# Patient Record
Sex: Female | Born: 1980 | Race: Black or African American | Hispanic: No | Marital: Married | Smoking: Never smoker
Health system: Southern US, Community
[De-identification: ages and names within clinical notes are randomized; demographics above are authoritative.]

## PROBLEM LIST (undated history)

## (undated) DIAGNOSIS — D649 Anemia, unspecified: Secondary | ICD-10-CM

## (undated) DIAGNOSIS — E669 Obesity, unspecified: Secondary | ICD-10-CM

## (undated) DIAGNOSIS — Z975 Presence of (intrauterine) contraceptive device: Secondary | ICD-10-CM

## (undated) DIAGNOSIS — N946 Dysmenorrhea, unspecified: Secondary | ICD-10-CM

## (undated) HISTORY — DX: Presence of (intrauterine) contraceptive device: Z97.5

## (undated) HISTORY — DX: Anemia, unspecified: D64.9

## (undated) HISTORY — DX: Dysmenorrhea, unspecified: N94.6

## (undated) HISTORY — DX: Obesity, unspecified: E66.9

---
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
## 2011-01-30 HISTORY — PX: DILATION AND CURETTAGE OF UTERUS: SHX78

## 2012-01-30 DIAGNOSIS — Z975 Presence of (intrauterine) contraceptive device: Secondary | ICD-10-CM

## 2012-01-30 HISTORY — DX: Presence of (intrauterine) contraceptive device: Z97.5

## 2012-01-30 HISTORY — PX: INSERTION OF CONTRACEPTIVE CAPSULE: SHX680

## 2015-07-25 ENCOUNTER — Encounter: Payer: Self-pay | Admitting: Medical

## 2015-08-15 ENCOUNTER — Ambulatory Visit (INDEPENDENT_AMBULATORY_CARE_PROVIDER_SITE_OTHER): Payer: PRIVATE HEALTH INSURANCE | Admitting: Medical

## 2015-08-15 ENCOUNTER — Encounter: Payer: Self-pay | Admitting: Medical

## 2015-08-15 VITALS — BP 126/74 | HR 92 | Ht 62.75 in | Wt 312.0 lb

## 2015-08-15 DIAGNOSIS — Z139 Encounter for screening, unspecified: Secondary | ICD-10-CM | POA: Diagnosis not present

## 2015-08-15 DIAGNOSIS — Z0000 Encounter for general adult medical examination without abnormal findings: Secondary | ICD-10-CM

## 2015-08-15 DIAGNOSIS — N946 Dysmenorrhea, unspecified: Secondary | ICD-10-CM | POA: Diagnosis not present

## 2015-08-15 DIAGNOSIS — D509 Iron deficiency anemia, unspecified: Secondary | ICD-10-CM | POA: Diagnosis not present

## 2015-08-15 DIAGNOSIS — E669 Obesity, unspecified: Secondary | ICD-10-CM | POA: Diagnosis not present

## 2015-08-15 DIAGNOSIS — N898 Other specified noninflammatory disorders of vagina: Secondary | ICD-10-CM

## 2015-08-15 DIAGNOSIS — Z975 Presence of (intrauterine) contraceptive device: Secondary | ICD-10-CM | POA: Diagnosis not present

## 2015-08-15 DIAGNOSIS — Z8349 Family history of other endocrine, nutritional and metabolic diseases: Secondary | ICD-10-CM

## 2015-08-15 DIAGNOSIS — R350 Frequency of micturition: Secondary | ICD-10-CM

## 2015-08-15 LAB — POCT WET PREP (WET MOUNT)

## 2015-08-15 LAB — POCT URINALYSIS DIPSTICK
Bilirubin, UA: NEGATIVE
Glucose, UA: NEGATIVE
Ketones, UA: NEGATIVE
LEUKOCYTES UA: NEGATIVE
NITRITE UA: NEGATIVE
PROTEIN UA: NEGATIVE
SPEC GRAV UA: 1.02
UROBILINOGEN UA: 0.2
pH, UA: 7

## 2015-08-15 NOTE — Progress Notes (Signed)
Subjective:   HPI  Tamara Rowland is a 35 y.o. female who presents for a complete physical.  New patient today.   Daughter of my patient Tamara Rowland.     2015 - last tetanus booster.  Gets flu shot yearly.  Works at [employer] in environmental services.  Has heavy periods, dysmenorrhea, irregular periods despite being on Nexplanon.   During periods, has to sometimes wear 2 sanitary napkins, 2 spoonful size of clots.   Uterine polyps.   Has hx/o 1 pregnancy, 1 live birth, last pap 2 years ago.  Currently on phentermine.  Has been seeing bariatric clinic. Did have consult for weight loss surgery, but has been able to see significant improvement with lifestyle changes too.  Wants me to take over the prescription for phentermine for ongoing use.  Reviewed their medical, surgical, family, social, medication, and allergy history and updated chart as appropriate.  Past Medical History  Diagnosis Date  . Anemia 2003    ongoing, has had heavy periods, iron deficiency  . Obesity   . Dysmenorrhea   . Nexplanon in place 01/2012    Past Surgical History  Procedure Laterality Date  . Dilation and curettage of uterus  2013  . Insertion of contraceptive capsule  01/2012    nexplanon    Social History   Social History  . Marital Status: Single    Spouse Name: N/A  . Number of Children: N/A  . Years of Education: N/A   Occupational History  . Not on file.   Social History Main Topics  . Smoking status: Never Smoker   . Smokeless tobacco: Not on file  . Alcohol Use: No  . Drug Use: No  . Sexual Activity: Not on file   Other Topics Concern  . Not on file   Social History Narrative   Works in Public affairs consultant at [employer], lives in [place], works in [place].   Has one child.  Exercise - sometimes goes to the gym, goes to [employer].  No significant other.  Divorced.   07/2015.    Family History  Problem Relation Age of Onset  . Hypertension Mother    . Obesity Mother   . Diabetes Maternal Aunt   . Cancer Maternal Grandmother     stomach  . Diabetes Maternal Grandmother   . Stroke Neg Hx   . Heart disease Neg Hx   . Cancer Other     stomach, cervical, bone, prostate, distant relatives  . Diabetes Maternal Aunt      Current outpatient prescriptions:  .  phentermine 37.5 MG capsule, Take 37.5 mg by mouth every morning., Disp: , Rfl:   No Known Allergies   Review of Systems Constitutional: -fever, -chills, -sweats, -unexpected weight change, -decreased appetite, -fatigue Allergy: -sneezing, -itching, -congestion Dermatology: -changing moles, --rash, -lumps ENT: -runny nose, -ear pain, -sore throat, -hoarseness, -sinus pain, -teeth pain, - ringing in ears, -hearing loss, -nosebleeds Cardiology: -chest pain, -palpitations, -swelling, -difficulty breathing when lying flat, -waking up short of breath Respiratory: -cough, -shortness of breath, -difficulty breathing with exercise or exertion, -wheezing, -coughing up blood Gastroenterology: -abdominal pain, -nausea, -vomiting, -diarrhea, -constipation, -blood in stool, -changes in bowel movement, -difficulty swallowing or eating Hematology: -bleeding, -bruising  Musculoskeletal: -joint aches, -muscle aches, -joint swelling, -back pain, -neck pain, -cramping, -changes in gait Ophthalmology: denies vision changes, eye redness, itching, discharge Urology: -burning with urination, -difficulty urinating, -blood in urine, +urinary frequency, -urgency, -incontinence Neurology: -headache, -weakness, -tingling, -numbness, -memory loss, -falls, -  dizziness Psychology: -depressed mood, -agitation, -sleep problems     Objective:   Physical Exam  BP 126/74 mmHg  Pulse 92  Ht 5' 2.75" (1.594 m)  Wt 312 lb (141.522 kg)  BMI 55.70 kg/m2  General appearance: alert, no distress, WD/WN, obese AA female Skin: tattoos bilat upper lateral arms, wine the pooh tattoo right lateral upper arm, scattered  macules, no worrisome lesions HEENT: normocephalic, conjunctiva/corneas normal, sclerae anicteric, PERRLA, EOMi, nares patent, no discharge or erythema, pharynx normal Oral cavity: MMM, tongue normal, teeth in good repair Neck: supple, no lymphadenopathy, no thyromegaly, no masses, normal ROM, no bruits Chest: non tender, normal shape and expansion Heart: RRR, normal S1, S2, no murmurs Lungs: CTA bilaterally, no wheezes, rhonchi, or rales Abdomen: +bs, soft, non tender, non distended, no masses, no hepatomegaly, no splenomegaly, no bruits Back: non tender, normal ROM, no scoliosis Musculoskeletal: upper extremities non tender, no obvious deformity, normal ROM throughout, lower extremities non tender, no obvious deformity, normal ROM throughout Extremities: no edema, no cyanosis, no clubbing Pulses: 2+ symmetric, upper and lower extremities, normal cap refill Neurological: alert, oriented x 3, CN2-12 intact, strength normal upper extremities and lower extremities, sensation normal throughout, DTRs 2+ throughout, no cerebellar signs, gait normal Psychiatric: normal affect, behavior normal, pleasant  Breast/gyn/rectal - deferred to gyn    Assessment and Plan :    Encounter Diagnoses  Name Primary?  . Routine general medical examination at a health care facility Yes  . Obesity   . Nexplanon in place   . Anemia, iron deficiency   . Dysmenorrhea   . Screening for condition   . Urinary frequency   . Vaginal discharge   . Family history of thyroid disease    Physical exam - discussed healthy lifestyle, diet, exercise, preventative care, vaccinations, and addressed their concerns.  Handout given. See your eye doctor yearly for routine vision care. See your dentist yearly for routine dental care including hygiene visits twice yearly. Refer to gynecology, needs pap smear update  Obesity- c/t efforts with diet, exercise.  discussed her current use of phentermine.  Consider other safer  ongoing alternatives, still keep weight loss surgery on the table as an option  Refer to gynecology regarding dysmenorrhea, Nexplanon due to come out 01/2016  Vaginal discharge, urinary frequency - urine culture sent.   Self wet prep unremarkable  Follow-up pending labs

## 2015-08-16 ENCOUNTER — Other Ambulatory Visit: Payer: Self-pay | Admitting: Medical

## 2015-08-16 LAB — COMPREHENSIVE METABOLIC PANEL
ALBUMIN: 3.9 g/dL (ref 3.6–5.1)
ALK PHOS: 63 U/L (ref 33–115)
ALT: 10 U/L (ref 6–29)
AST: 8 U/L — AB (ref 10–30)
BILIRUBIN TOTAL: 0.4 mg/dL (ref 0.2–1.2)
BUN: 11 mg/dL (ref 7–25)
CALCIUM: 8.8 mg/dL (ref 8.6–10.2)
CO2: 27 mmol/L (ref 20–31)
CREATININE: 0.69 mg/dL (ref 0.50–1.10)
Chloride: 105 mmol/L (ref 98–110)
Glucose, Bld: 68 mg/dL (ref 65–99)
Potassium: 3.6 mmol/L (ref 3.5–5.3)
SODIUM: 142 mmol/L (ref 135–146)
TOTAL PROTEIN: 6.9 g/dL (ref 6.1–8.1)

## 2015-08-16 LAB — HEPATITIS B SURFACE ANTIBODY, QUANTITATIVE: Hepatitis B-Post: 53 m[IU]/mL

## 2015-08-16 LAB — CBC
HEMATOCRIT: 36.8 % (ref 35.0–45.0)
HEMOGLOBIN: 11 g/dL — AB (ref 11.7–15.5)
MCH: 22.4 pg — AB (ref 27.0–33.0)
MCHC: 29.9 g/dL — AB (ref 32.0–36.0)
MCV: 74.8 fL — AB (ref 80.0–100.0)
MPV: 8.7 fL (ref 7.5–12.5)
Platelets: 309 10*3/uL (ref 140–400)
RBC: 4.92 MIL/uL (ref 3.80–5.10)
RDW: 16.7 % — ABNORMAL HIGH (ref 11.0–15.0)
WBC: 10 10*3/uL (ref 4.0–10.5)

## 2015-08-16 LAB — TSH: TSH: 3.15 m[IU]/L

## 2015-08-16 LAB — HEMOGLOBIN A1C
HEMOGLOBIN A1C: 6 % — AB (ref <5.7)
MEAN PLASMA GLUCOSE: 126 mg/dL

## 2015-08-16 LAB — LIPID PANEL
CHOLESTEROL: 129 mg/dL (ref 125–200)
HDL: 45 mg/dL — AB (ref >=46)
LDL Cholesterol: 71 mg/dL (ref <130)
TRIGLYCERIDES: 64 mg/dL (ref <150)
Total CHOL/HDL Ratio: 2.9 Ratio (ref <=5.0)
VLDL: 13 mg/dL (ref <30)

## 2015-08-16 LAB — T4, FREE: Free T4: 1 ng/dL (ref 0.8–1.8)

## 2015-08-16 LAB — URINE CULTURE: ORGANISM ID, BACTERIA: NO GROWTH

## 2015-08-16 MED ORDER — PHENTERMINE-TOPIRAMATE ER 3.75-23 MG PO CP24: 1.0000 | ORAL_CAPSULE | ORAL | Status: DC

## 2015-08-16 MED ORDER — PHENTERMINE-TOPIRAMATE ER 7.5-46 MG PO CP24: 1.0000 | ORAL_CAPSULE | ORAL | Status: DC

## 2015-08-16 NOTE — Addendum Note (Signed)
Addended by: Kieth BrightlyLAWSON, Ludene Stokke M on: 08/16/2015 11:35 AM   Modules accepted: Kipp BroodSmartSet

## 2015-08-16 NOTE — Addendum Note (Signed)
Addended by: Kieth BrightlyLAWSON, Heidemarie Goodnow M on: 08/16/2015 10:14 AM   Modules accepted: Orders, SmartSet

## 2015-09-09 ENCOUNTER — Ambulatory Visit: Payer: PRIVATE HEALTH INSURANCE | Admitting: Obstetrics and Gynecology

## 2015-09-13 ENCOUNTER — Telehealth: Payer: Self-pay | Admitting: Medical

## 2015-09-13 NOTE — Telephone Encounter (Signed)
Pt said that she forgot about her appt, and has been referred to another office. Said she will not no show again

## 2015-09-13 NOTE — Telephone Encounter (Signed)
Please call patient and inquire.  I received notification from Gastroenterology that she either no showed or wasn't able to be contacted for their appointment in which we referred them.   Please make sure they are in agreement for the referral, and make sure they are aware that when we refer to a specialist, no showing or not being able to be reached can result in the specialist not taking them as a patient, make cause them to incur no show fees, and is just not appropriate in general to us or them.  Please either have them call back to set up the appointment or document their reply.

## 2015-09-16 ENCOUNTER — Other Ambulatory Visit: Payer: Self-pay | Admitting: Medical

## 2015-09-16 ENCOUNTER — Telehealth: Payer: Self-pay

## 2015-09-16 MED ORDER — PHENTERMINE HCL 37.5 MG PO CAPS: 37.5000 mg | ORAL_CAPSULE | ORAL | 0 refills | Status: DC

## 2015-09-16 NOTE — Telephone Encounter (Signed)
Call out 36mo of plain phentermine then recheck to discuss. I can't keep her on generic phentermine as its not safe to use ongoing, its only meant to be used short term a few times per year.

## 2015-09-16 NOTE — Telephone Encounter (Signed)
Pt is aware and phoned in

## 2015-09-16 NOTE — Telephone Encounter (Signed)
Pt left me a voicemail regarding her Qsymia stating she doesn't think her body is adjusting to it well. When I called her back she said that she is more hungry and eating way more. Has gained 3 pounds already. Said that she is more exhausted now then being on phentermine. York SpanielSaid it makes her chest feel heavy as well. Wants her phentermine.

## 2015-10-10 ENCOUNTER — Ambulatory Visit: Payer: PRIVATE HEALTH INSURANCE | Admitting: Gynecology

## 2015-10-10 DIAGNOSIS — Z0289 Encounter for other administrative examinations: Secondary | ICD-10-CM

## 2015-10-13 ENCOUNTER — Telehealth: Payer: Self-pay | Admitting: Medical

## 2015-10-13 NOTE — Telephone Encounter (Signed)
Pt called and states that she would like a rx for iron states that it is a red pill that starts with a F, states you and her discussed this at her last visit, and that she has low Iron pt uses [facility] [place] - [address] and pt can be reached at [phone]

## 2015-10-14 ENCOUNTER — Other Ambulatory Visit: Payer: Self-pay | Admitting: Medical

## 2015-10-14 MED ORDER — FERROUS GLUCONATE 324 (38 FE) MG PO TABS: 324.0000 mg | ORAL_TABLET | Freq: Every day | ORAL | 2 refills | Status: DC

## 2015-10-14 NOTE — Telephone Encounter (Signed)
Called and informed pt that RX was sent in, she also states that she has not started the weight loss medicine states she was waiting until she stopped her menstrual cycle, states she was going to start he weight loss medicine on Monday and would call and make a FU appt after she start the medicine.

## 2015-10-14 NOTE — Telephone Encounter (Signed)
Med sent.   If she started the weigh toss medication from last visit, she is due f/u at this time.

## 2016-04-10 ENCOUNTER — Encounter: Payer: Self-pay | Admitting: Podiatry

## 2016-06-13 ENCOUNTER — Encounter: Payer: Self-pay | Admitting: Gynecology

## 2016-06-21 ENCOUNTER — Encounter (HOSPITAL_COMMUNITY): Payer: Self-pay | Admitting: Emergency Medicine

## 2016-06-21 ENCOUNTER — Emergency Department (HOSPITAL_COMMUNITY)
Admission: EM | Admit: 2016-06-21 | Discharge: 2016-06-21 | Disposition: A | Discharge status: Home / Self Care | Payer: Medicaid Other | Attending: Emergency Medicine | Admitting: Emergency Medicine

## 2016-06-21 ENCOUNTER — Emergency Department (HOSPITAL_COMMUNITY): Payer: Medicaid Other

## 2016-06-21 DIAGNOSIS — Z79899 Other long term (current) drug therapy: Secondary | ICD-10-CM | POA: Insufficient documentation

## 2016-06-21 DIAGNOSIS — M722 Plantar fascial fibromatosis: Secondary | ICD-10-CM | POA: Diagnosis not present

## 2016-06-21 DIAGNOSIS — M79671 Pain in right foot: Secondary | ICD-10-CM | POA: Diagnosis present

## 2016-06-21 LAB — POCT PREGNANCY, URINE: PREG TEST UR: NEGATIVE

## 2016-06-21 MED ORDER — DICLOFENAC SODIUM 75 MG PO TBEC: 75.0000 mg | DELAYED_RELEASE_TABLET | Freq: Two times a day (BID) | ORAL | 0 refills | Status: DC

## 2016-06-21 NOTE — ED Triage Notes (Signed)
Pt c/o sudden R foot / heel pain this morning when getting out of bed. Pt states difficulty walking, pain when walking that radiates up leg. Pt denies injury.

## 2016-06-21 NOTE — ED Provider Notes (Signed)
[facility]EM[facility]ENCY [facility] Provider Note   CSN: [id number] Arrival date & time: 06/21/16  4098  By signing my name below, I, Linna Darner, attest that this documentation has been prepared under the direction and in the presence of Langston Masker, [place]. Electronically Signed: Linna Darner, Scribe. 06/21/2016. 10:43 AM.  History   Chief Complaint Chief Complaint  Patient presents with  . Foot Pain   The history is provided by the patient. No language interpreter was used.    HPI Comments: Tamara Rowland is a 36 y.o. female who presents to the Emergency Department complaining of constant right heel pain beginning this morning upon waking. She states her pain is worse with weight bearing and ambulating. She has no h/o the same. No medications or treatments tried PTA. Patient is currently unemployed but previously held a job for which she was on her feet often. She denies numbness/tingling, focal weakness, or any other associated symptoms.  Past Medical History:  Diagnosis Date  . Anemia 2003   ongoing, has had heavy periods, iron deficiency  . Dysmenorrhea   . Nexplanon in place 01/2012  . Obesity     Patient Active Problem List   Diagnosis Date Noted  . Dysmenorrhea 08/15/2015  . Anemia, iron deficiency 08/15/2015  . Nexplanon in place 08/15/2015  . Obesity 08/15/2015  . Routine general medical examination at a health care facility 08/15/2015  . Screening for condition 08/15/2015  . Urinary frequency 08/15/2015  . Family history of thyroid disease 08/15/2015  . Vaginal discharge 08/15/2015    Past Surgical History:  Procedure Laterality Date  . DILATION AND CURETTAGE OF UTERUS  2013  . INSERTION OF CONTRACEPTIVE CAPSULE  01/2012   nexplanon    OB History    No data available       Home Medications    Prior to Admission medications   Medication Sig Start Date End Date Taking? Authorizing Provider  ferrous gluconate (FERGON) 324 MG tablet Take 1 tablet (324 mg total) by  mouth daily with breakfast. 10/14/15   Tysinger, [name], PA-C  phentermine 37.5 MG capsule Take 1 capsule (37.5 mg total) by mouth every morning. 09/16/15   Tysinger, [name], PA-C  Phentermine-Topiramate (QSYMIA) 3.75-23 MG CP24 Take 1 capsule by mouth every morning. 08/16/15   Tysinger, [name], PA-C  Phentermine-Topiramate (QSYMIA) 7.5-46 MG CP24 Take 1 capsule by mouth every morning. 08/16/15   Tysinger, [name], PA-C    Family History Family History  Problem Relation Age of Onset  . Hypertension Mother   . Obesity Mother   . Diabetes Maternal Aunt   . Cancer Maternal Grandmother        stomach  . Diabetes Maternal Grandmother   . Cancer Other        stomach, cervical, bone, prostate, distant relatives  . Diabetes Maternal Aunt   . Stroke Neg Hx   . Heart disease Neg Hx     Social History Social History  Substance Use Topics  . Smoking status: Never Smoker  . Smokeless tobacco: Never Used  . Alcohol use No     Allergies   Patient has no known allergies.   Review of Systems Review of Systems  Musculoskeletal: Positive for arthralgias.  Neurological: Negative for weakness and numbness.  All other systems reviewed and are negative.  Physical Exam Updated Vital Signs BP (!) 145/82   Pulse 92   Temp 98.4 F (36.9 C) (Oral)   Resp 18   LMP 06/12/2016 (Exact Date)  SpO2 99%   Physical Exam  Constitutional: She is oriented to person, place, and time. She appears well-developed and well-nourished. No distress.  HENT:  Head: Normocephalic and atraumatic.  Eyes: Conjunctivae and EOM are normal.  Neck: Neck supple. No tracheal deviation present.  Cardiovascular: Normal rate.   Pulmonary/Chest: Effort normal. No respiratory distress.  Musculoskeletal: She exhibits tenderness.  Tender right heel and pain with ROM.  Neurological: She is alert and oriented to person, place, and time.  Skin: Skin is warm and dry.  Psychiatric: She has a normal mood and affect. Her  behavior is normal.  Nursing note and vitals reviewed.  ED Treatments / Results  Labs (all labs ordered are listed, but only abnormal results are displayed) Labs Reviewed  POC URINE PREG, ED    EKG  EKG Interpretation None       Radiology Dg Foot Complete Right  Result Date: 06/21/2016 CLINICAL DATA:  Right foot and heel pain. EXAM: RIGHT FOOT COMPLETE - 3+ VIEW COMPARISON:  No prior. FINDINGS: No acute bony or joint abnormality identified. No evidence of fracture or dislocation. Soft tissues are unremarkable. IMPRESSION: No acute abnormality. Electronically Signed   By: Maisie Fushomas  Register   On: 06/21/2016 10:30    Procedures Procedures (including critical care time)  DIAGNOSTIC STUDIES: Oxygen Saturation is 99% on RA, normal by my interpretation.    COORDINATION OF CARE: 10:39 AM Discussed treatment plan with pt at bedside and pt agreed to plan.  Medications Ordered in ED Medications - No data to display   Initial Impression / Assessment and Plan / ED Course  I have reviewed the triage vital signs and the nursing notes.  Pertinent labs & imaging results that were available during my care of the patient were reviewed by me and considered in my medical decision making (see chart for details).  Patient's x-ray shows a calcaneal spur consistent with chronic plantar fasciitis.   Final Clinical Impressions(s) / ED Diagnoses   Final diagnoses:  Plantar fasciitis of right foot    New Prescriptions Discharge Medication List as of 06/21/2016 10:44 AM    START taking these medications   Details  diclofenac (VOLTAREN) 75 MG EC tablet Take 1 tablet (75 mg total) by mouth 2 (two) times daily., Starting Thu 06/21/2016, Print       I personally performed the services in this documentation, which was scribed in my presence.  The recorded information has been reviewed and considered.   Barnet PallKaren SofiaPAC. An After Visit Summary was printed and given to the patient.   Elson AreasSofia, Leslie  K, PA-C 06/21/16 1431    Pricilla LovelessGoldston, Scott, MD 06/22/16 [phone]

## 2016-06-22 NOTE — Progress Notes (Signed)
DOS 04/10/2016 [place] Bunionectomy (cutting and moving bone) with pin fixation(right)foot

## 2016-07-02 ENCOUNTER — Ambulatory Visit: Payer: Medicaid Other | Admitting: Podiatry

## 2016-07-04 ENCOUNTER — Ambulatory Visit: Payer: Medicaid Other | Admitting: Podiatry

## 2016-07-23 ENCOUNTER — Ambulatory Visit: Payer: Medicaid Other | Admitting: Podiatry

## 2016-12-08 ENCOUNTER — Encounter (HOSPITAL_COMMUNITY): Payer: Self-pay | Admitting: Emergency Medicine

## 2016-12-08 ENCOUNTER — Ambulatory Visit (HOSPITAL_COMMUNITY)
Admission: EM | Admit: 2016-12-08 | Discharge: 2016-12-08 | Disposition: A | Discharge status: Home / Self Care | Payer: Medicaid Other | Attending: Radiology | Admitting: Radiology

## 2016-12-08 ENCOUNTER — Other Ambulatory Visit: Payer: Self-pay

## 2016-12-08 DIAGNOSIS — R059 Cough, unspecified: Secondary | ICD-10-CM

## 2016-12-08 DIAGNOSIS — R05 Cough: Secondary | ICD-10-CM | POA: Diagnosis not present

## 2016-12-08 MED ORDER — PREDNISONE 20 MG PO TABS: 40.0000 mg | ORAL_TABLET | Freq: Every day | ORAL | 0 refills | Status: AC

## 2016-12-08 MED ORDER — BENZONATATE 100 MG PO CAPS: 100.0000 mg | ORAL_CAPSULE | Freq: Three times a day (TID) | ORAL | 0 refills | Status: DC

## 2016-12-08 NOTE — Discharge Instructions (Signed)
Continue to push fluid.

## 2016-12-08 NOTE — ED Provider Notes (Signed)
[facility]    CSN: [id number] Arrival date & time: 12/08/16  1934     History   Chief Complaint Chief Complaint  Patient presents with  . Cough    HPI Tamara Rowland is a 36 y.o. female.   36 y.o. female presents with residual cough, shob  X 1 week. Patient states that she had cold that self resolved but endorses a persistent cough. Condition is acute in nature. Condition is made better by nothing. Condition is made worse by nothing. Patient denies any treatment prior to there arrival at this facility.        Past Medical History:  Diagnosis Date  . Anemia 2003   ongoing, has had heavy periods, iron deficiency  . Dysmenorrhea   . Nexplanon in place 01/2012  . Obesity     Patient Active Problem List   Diagnosis Date Noted  . Dysmenorrhea 08/15/2015  . Anemia, iron deficiency 08/15/2015  . Nexplanon in place 08/15/2015  . Obesity 08/15/2015  . Routine general medical examination at a health care facility 08/15/2015  . Screening for condition 08/15/2015  . Urinary frequency 08/15/2015  . Family history of thyroid disease 08/15/2015  . Vaginal discharge 08/15/2015    Past Surgical History:  Procedure Laterality Date  . DILATION AND CURETTAGE OF UTERUS  2013  . INSERTION OF CONTRACEPTIVE CAPSULE  01/2012   nexplanon    OB History    No data available       Home Medications    Prior to Admission medications   Medication Sig Start Date End Date Taking? Authorizing Provider  phentermine 37.5 MG capsule Take 1 capsule (37.5 mg total) by mouth every morning. 09/16/15  Yes Tysinger, Kermit Baloavid S, PA-C    Family History Family History  Problem Relation Age of Onset  . Hypertension Mother   . Obesity Mother   . Diabetes Maternal Aunt   . Cancer Maternal Grandmother        stomach  . Diabetes Maternal Grandmother   . Cancer Other        stomach, cervical, bone, prostate, distant relatives  . Diabetes Maternal Aunt   . Stroke Neg Hx   . Heart  disease Neg Hx     Social History Social History   Tobacco Use  . Smoking status: Never Smoker  . Smokeless tobacco: Never Used  Substance Use Topics  . Alcohol use: No    Alcohol/week: 0.0 oz  . Drug use: No     Allergies   Patient has no known allergies.   Review of Systems Review of Systems  Constitutional: Negative for chills and fever.  HENT: Negative for ear pain and sore throat.   Eyes: Negative for pain and visual disturbance.  Respiratory: Positive for cough ( non productive). Negative for shortness of breath.   Cardiovascular: Negative for chest pain and palpitations.  Gastrointestinal: Negative for abdominal pain and vomiting.  Genitourinary: Negative for dysuria and hematuria.  Musculoskeletal: Negative for arthralgias and back pain.  Skin: Negative for color change and rash.  Neurological: Negative for seizures and syncope.  All other systems reviewed and are negative.    Physical Exam Triage Vital Signs ED Triage Vitals  Enc Vitals Group     BP 12/08/16 1954 (!) 153/72     Pulse Rate 12/08/16 1954 93     Resp 12/08/16 1954 20     Temp 12/08/16 1954 99.3 F (37.4 C)     Temp Source 12/08/16 1954  Oral     SpO2 12/08/16 1954 100 %     Weight --      Height --      Head Circumference --      Peak Flow --      Pain Score 12/08/16 1955 0     Pain Loc --      Pain Edu? --      Excl. in GC? --    No data found.  Updated Vital Signs BP (!) 153/72 (BP Location: Left Wrist)   Pulse 93   Temp 99.3 F (37.4 C) (Oral)   Resp 20   SpO2 100%   Visual Acuity Right Eye Distance:   Left Eye Distance:   Bilateral Distance:    Right Eye Near:   Left Eye Near:    Bilateral Near:     Physical Exam  Constitutional: She is oriented to person, place, and time. She appears well-developed and well-nourished.  HENT:  Head: Normocephalic and atraumatic.  Eyes: Conjunctivae are normal.  Neck: Normal range of motion.  Cardiovascular: Normal rate and  regular rhythm.  Pulmonary/Chest: Effort normal.  Neurological: She is alert and oriented to person, place, and time.  Psychiatric: She has a normal mood and affect.  Nursing note and vitals reviewed.    UC Treatments / Results  Labs (all labs ordered are listed, but only abnormal results are displayed) Labs Reviewed - No data to display  EKG  EKG Interpretation None       Radiology No results found.  Procedures Procedures (including critical care time)  Medications Ordered in UC Medications - No data to display   Initial Impression / Assessment and Plan / UC Course  I have reviewed the triage vital signs and the nursing notes.  Pertinent labs & imaging results that were available during my care of the patient were reviewed by me and considered in my medical decision making (see chart for details).       Final Clinical Impressions(s) / UC Diagnoses   Final diagnoses:  None    ED Discharge Orders    None       Controlled Substance Prescriptions [place] Controlled Substance Registry consulted? Not Applicable   Alene MiresOmohundro, Keymani Glynn C, NP 12/08/16 2001

## 2016-12-08 NOTE — ED Triage Notes (Signed)
[facility] a [facility] of cold symptoms x 2 weeks that over the last three days has developed into a cough with chest pain and shortness of breath after coughing.

## 2016-12-27 ENCOUNTER — Other Ambulatory Visit: Payer: Self-pay

## 2016-12-27 ENCOUNTER — Encounter (HOSPITAL_COMMUNITY): Payer: Self-pay | Admitting: Emergency Medicine

## 2016-12-27 DIAGNOSIS — B9789 Other viral agents as the cause of diseases classified elsewhere: Secondary | ICD-10-CM | POA: Diagnosis not present

## 2016-12-27 DIAGNOSIS — J069 Acute upper respiratory infection, unspecified: Secondary | ICD-10-CM | POA: Diagnosis not present

## 2016-12-27 DIAGNOSIS — Z79899 Other long term (current) drug therapy: Secondary | ICD-10-CM | POA: Insufficient documentation

## 2016-12-27 DIAGNOSIS — R05 Cough: Secondary | ICD-10-CM | POA: Diagnosis present

## 2016-12-27 NOTE — ED Triage Notes (Signed)
Pt states she has had a cough for the past several weeks  Pt states [facility] and was given a cough suppressant and steroids  Pt states she feels worse  Pt states she is coughing so hard she cannot stop and it is making her chest hurt  Pt states she is aching all over  Pt is also c/o sore throat and hoarse

## 2016-12-28 ENCOUNTER — Emergency Department (HOSPITAL_COMMUNITY): Payer: Medicaid Other

## 2016-12-28 ENCOUNTER — Emergency Department (HOSPITAL_COMMUNITY)
Admission: EM | Admit: 2016-12-28 | Discharge: 2016-12-28 | Disposition: A | Discharge status: Home / Self Care | Payer: Medicaid Other | Attending: Emergency Medicine | Admitting: Emergency Medicine

## 2016-12-28 ENCOUNTER — Other Ambulatory Visit: Payer: Self-pay

## 2016-12-28 DIAGNOSIS — J069 Acute upper respiratory infection, unspecified: Secondary | ICD-10-CM

## 2016-12-28 DIAGNOSIS — B9789 Other viral agents as the cause of diseases classified elsewhere: Secondary | ICD-10-CM

## 2016-12-28 LAB — RAPID STREP SCREEN (MED CTR MEBANE ONLY): Streptococcus, Group A Screen (Direct): NEGATIVE

## 2016-12-28 MED ORDER — BENZONATATE 100 MG PO CAPS: 100.0000 mg | ORAL_CAPSULE | Freq: Three times a day (TID) | ORAL | 0 refills | Status: DC

## 2016-12-28 MED ORDER — ALBUTEROL SULFATE HFA 108 (90 BASE) MCG/ACT IN AERS: 1.0000 | INHALATION_SPRAY | Freq: Four times a day (QID) | RESPIRATORY_TRACT | 0 refills | Status: DC | PRN

## 2016-12-28 NOTE — ED Provider Notes (Signed)
[facility]-EMERGENCY DEPT Provider Note   CSN: [id number] Arrival date & time: 12/27/16  2049     History   Chief Complaint Chief Complaint  Patient presents with  . Cough    HPI Tamara Rowland is a 36 y.o. female.  The history is provided by the patient and medical records.    36 year old female with history of anemia, obesity, presenting to the ED for cough.  Reports she has had this for several weeks.  She was initially seen at urgent care 2 weeks ago and symptoms were felt to be viral.  States she was prescribed prednisone and cough medication, she did not tolerate the prednisone well so she stopped it.  States cough now feels worse, she has associated sore throat and hoarse voice.  States she does have to talk a lot at work and has been very difficult for her.  States if she coughs a lot she does get winded and has some pain in her ribs.  She denies any shortness of breath.  No fever but has had some intermittent chills.  She has not had any known sick contacts.  No history of asthma, COPD.  Past Medical History:  Diagnosis Date  . Anemia 2003   ongoing, has had heavy periods, iron deficiency  . Dysmenorrhea   . Nexplanon in place 01/2012  . Obesity     Patient Active Problem List   Diagnosis Date Noted  . Dysmenorrhea 08/15/2015  . Anemia, iron deficiency 08/15/2015  . Nexplanon in place 08/15/2015  . Obesity 08/15/2015  . Routine general medical examination at a health care facility 08/15/2015  . Screening for condition 08/15/2015  . Urinary frequency 08/15/2015  . Family history of thyroid disease 08/15/2015  . Vaginal discharge 08/15/2015    Past Surgical History:  Procedure Laterality Date  . DILATION AND CURETTAGE OF UTERUS  2013  . INSERTION OF CONTRACEPTIVE CAPSULE  01/2012   nexplanon    OB History    No data available       Home Medications    Prior to Admission medications   Medication Sig Start Date End Date Taking?  Authorizing Provider  benzonatate (TESSALON) 100 MG capsule Take 1 capsule (100 mg total) every 8 (eight) hours by mouth. 12/08/16   Alene Miresmohundro, Jennifer C, NP  phentermine 37.5 MG capsule Take 1 capsule (37.5 mg total) by mouth every morning. 09/16/15   Tysinger, Kermit Baloavid S, PA-C    Family History Family History  Problem Relation Age of Onset  . Hypertension Mother   . Obesity Mother   . Diabetes Maternal Aunt   . Cancer Maternal Grandmother        stomach  . Diabetes Maternal Grandmother   . Cancer Other        stomach, cervical, bone, prostate, distant relatives  . Diabetes Maternal Aunt   . Stroke Neg Hx   . Heart disease Neg Hx     Social History Social History   Tobacco Use  . Smoking status: Never Smoker  . Smokeless tobacco: Never Used  Substance Use Topics  . Alcohol use: No    Alcohol/week: 0.0 oz  . Drug use: No     Allergies   Patient has no known allergies.   Review of Systems Review of Systems  HENT: Positive for congestion and sore throat.   Respiratory: Positive for cough.   All other systems reviewed and are negative.    Physical Exam Updated Vital Signs BP 131/78 (BP  Location: Left Arm)   Pulse 99   Temp 99.3 F (37.4 C) (Oral)   Resp 18   Ht 5\' 2"  (1.575 m)   Wt (!) 151 kg (333 lb)   LMP 11/28/2016 (Approximate)   SpO2 100%   BMI 60.91 kg/m   Physical Exam  Constitutional: She is oriented to person, place, and time. She appears well-developed and well-nourished.  Morbidly obese  HENT:  Head: Normocephalic and atraumatic.  Right Ear: Tympanic membrane and ear canal normal.  Left Ear: Tympanic membrane and ear canal normal.  Nose: Mucosal edema present.  Mouth/Throat: Uvula is midline, oropharynx is clear and moist and mucous membranes are normal.  Tonsils 1+ bilaterally without exudate; PND present, mild erythema; uvula midline without evidence of peritonsillar abscess; handling secretions appropriately; no difficulty swallowing or  speaking; normal phonation without stridor  Eyes: Conjunctivae and EOM are normal. Pupils are equal, round, and reactive to light.  Neck: Normal range of motion.  Cardiovascular: Normal rate, regular rhythm and normal heart sounds.  Pulmonary/Chest: Effort normal and breath sounds normal.  Abdominal: Soft. Bowel sounds are normal.  Musculoskeletal: Normal range of motion.  Neurological: She is alert and oriented to person, place, and time.  Skin: Skin is warm and dry.  Psychiatric: She has a normal mood and affect.  Nursing note and vitals reviewed.    ED Treatments / Results  Labs (all labs ordered are listed, but only abnormal results are displayed) Labs Reviewed  RAPID STREP SCREEN (NOT AT [facility])    EKG  EKG Interpretation None       Radiology No results found.  Procedures Procedures (including critical care time)  Medications Ordered in ED Medications - No data to display   Initial Impression / Assessment and Plan / ED Course  I have reviewed the triage vital signs and the nursing notes.  Pertinent labs & imaging results that were available during my care of the patient were reviewed by me and considered in my medical decision making (see chart for details).  36 year old female here with cough, sore throat, nasal congestion.  Has been ongoing for a few weeks now.  Seen at urgent care 2 weeks ago and prescribed medications but did not tolerate them well.  Reports continued symptoms.  She denies any chest pain, but has had some rib discomfort with coughing.  Cough is mostly nonproductive.  Denies any shortness of breath or orthopnea.  On exam, patient is afebrile and nontoxic.  She is morbidly obese.  Her lung sounds are overall clear.  Some nasal congestion and postnasal drip noted.  No significant tonsillar edema or exudates.  We will plan for chest x-ray and strep screen.  Strep screen is negative, culture pending.  Chest x-ray with borderline to mild cardiomegaly  with mild central congestion.  Patient has not had any hypoxia or tachypnea here.  She does not appear clinically fluid overloaded.  States she has never been told she had mildly enlarged heart.  I feel that her presenting symptoms today are most likely viral in nature, possible bronchitis, however I have recommended that she follow-up closely with her primary care doctor given findings on her chest x-ray today.  I have low suspicion for acute ACS, PE, dissection, or significant CHF at this time so feel this can be done as an OP.  Discussed plan with patient, she acknowledged understanding and agreed with plan of care.  Return precautions given for new or worsening symptoms.  Final Clinical Impressions(s) /  ED Diagnoses   Final diagnoses:  Viral URI with cough    ED Discharge Orders        Ordered    albuterol (PROVENTIL HFA;VENTOLIN HFA) 108 (90 Base) MCG/ACT inhaler  Every 6 hours PRN     12/28/16 0236    benzonatate (TESSALON) 100 MG capsule  Every 8 hours     12/28/16 0236       Garlon Hatchet, PA-C 12/28/16 2952    Zadie Rhine, MD 12/28/16 [phone]

## 2016-12-28 NOTE — Discharge Instructions (Signed)
Take the prescribed medication as directed.  Patient to rest and drink fluids. As we discussed, you did have a mildly enlarged heart on your chest x-ray.  You need to follow-up closely with your primary care doctor about this so it can be monitored. Return to the ED for new or worsening symptoms.

## 2016-12-30 LAB — CULTURE, GROUP A STREP (THRC)

## 2017-04-10 ENCOUNTER — Emergency Department (HOSPITAL_COMMUNITY): Payer: Medicaid Other

## 2017-04-10 ENCOUNTER — Emergency Department (HOSPITAL_COMMUNITY)
Admission: EM | Admit: 2017-04-10 | Discharge: 2017-04-10 | Disposition: A | Discharge status: Home / Self Care | Payer: Medicaid Other | Attending: Physician Assistant | Admitting: Physician Assistant

## 2017-04-10 ENCOUNTER — Encounter (HOSPITAL_COMMUNITY): Payer: Self-pay | Admitting: Emergency Medicine

## 2017-04-10 ENCOUNTER — Other Ambulatory Visit: Payer: Self-pay

## 2017-04-10 DIAGNOSIS — R05 Cough: Secondary | ICD-10-CM | POA: Insufficient documentation

## 2017-04-10 DIAGNOSIS — R0981 Nasal congestion: Secondary | ICD-10-CM | POA: Insufficient documentation

## 2017-04-10 DIAGNOSIS — R509 Fever, unspecified: Secondary | ICD-10-CM | POA: Insufficient documentation

## 2017-04-10 DIAGNOSIS — J111 Influenza due to unidentified influenza virus with other respiratory manifestations: Secondary | ICD-10-CM

## 2017-04-10 MED ORDER — ACETAMINOPHEN 325 MG PO TABS
650.0000 mg | ORAL_TABLET | Freq: Once | ORAL | Status: AC | PRN
  Administered 2017-04-10: 650 mg via ORAL
  Filled 2017-04-10: qty 2

## 2017-04-10 MED ORDER — IBUPROFEN 800 MG PO TABS
800.0000 mg | ORAL_TABLET | Freq: Once | ORAL | Status: AC
  Administered 2017-04-10: 800 mg via ORAL
  Filled 2017-04-10: qty 1

## 2017-04-10 NOTE — Discharge Instructions (Signed)
Please use ibuprofen and Tylenol.  You can alternate between the 2 that you are always keeping your fever down.  Please use plenty of handwashing.  Drink plenty of fluids such as Gatorade plus water.  And do not go to work for the next several days.

## 2017-04-10 NOTE — ED Provider Notes (Signed)
[facility]-EMERGENCY DEPT Provider Note   CSN: [id number] Arrival date & time: 04/10/17  0910     History   Chief Complaint Chief Complaint  Patient presents with  . Flu Like Symptoms    HPI Shela Nevinbony Morrison is a 37 y.o. female.  HPI   37 year old female presenting with "flulike symptoms".  Patient says she feels like she got hit by a bus.  Patient reports that she has body aches, cough, congestion, fever, fatigue.  Patient reports that she is been getting sick on and off for the last several weeks.    Past Medical History:  Diagnosis Date  . Anemia 2003   ongoing, has had heavy periods, iron deficiency  . Dysmenorrhea   . Nexplanon in place 01/2012  . Obesity     Patient Active Problem List   Diagnosis Date Noted  . Dysmenorrhea 08/15/2015  . Anemia, iron deficiency 08/15/2015  . Nexplanon in place 08/15/2015  . Obesity 08/15/2015  . Routine general medical examination at a health care facility 08/15/2015  . Screening for condition 08/15/2015  . Urinary frequency 08/15/2015  . Family history of thyroid disease 08/15/2015  . Vaginal discharge 08/15/2015    Past Surgical History:  Procedure Laterality Date  . DILATION AND CURETTAGE OF UTERUS  2013  . INSERTION OF CONTRACEPTIVE CAPSULE  01/2012   nexplanon    OB History    No data available       Home Medications    Prior to Admission medications   Medication Sig Start Date End Date Taking? Authorizing Provider  albuterol (PROVENTIL HFA;VENTOLIN HFA) 108 (90 Base) MCG/ACT inhaler Inhale 1-2 puffs into the lungs every 6 (six) hours as needed for wheezing. Patient not taking: Reported on 04/10/2017 12/28/16   Garlon HatchetSanders, Lisa M, PA-C  benzonatate (TESSALON) 100 MG capsule Take 1 capsule (100 mg total) by mouth every 8 (eight) hours. Patient not taking: Reported on 04/10/2017 12/28/16   Garlon HatchetSanders, Lisa M, PA-C  phentermine 37.5 MG capsule Take 1 capsule (37.5 mg total) by mouth every  morning. Patient not taking: Reported on 04/10/2017 09/16/15   Tysinger, Kermit Baloavid S, PA-C    Family History Family History  Problem Relation Age of Onset  . Hypertension Mother   . Obesity Mother   . Diabetes Maternal Aunt   . Cancer Maternal Grandmother        stomach  . Diabetes Maternal Grandmother   . Cancer Other        stomach, cervical, bone, prostate, distant relatives  . Diabetes Maternal Aunt   . Stroke Neg Hx   . Heart disease Neg Hx     Social History Social History   Tobacco Use  . Smoking status: Never Smoker  . Smokeless tobacco: Never Used  Substance Use Topics  . Alcohol use: No    Alcohol/week: 0.0 oz  . Drug use: No     Allergies   Patient has no known allergies.   Review of Systems Review of Systems  Constitutional: Positive for activity change, fatigue and fever.  HENT: Positive for congestion and sinus pain.   Respiratory: Positive for cough. Negative for shortness of breath.   Cardiovascular: Negative for chest pain.  Gastrointestinal: Negative for abdominal pain.  All other systems reviewed and are negative.    Physical Exam Updated Vital Signs BP 127/63 (BP Location: Right Wrist)   Pulse 94   Temp 99 F (37.2 C) (Oral)   Resp 18   Ht 5\' 2"  (1.575  m)   Wt (!) 151 kg (333 lb)   LMP 03/28/2017 (Approximate)   SpO2 98%   BMI 60.91 kg/m   Physical Exam  Constitutional: She is oriented to person, place, and time. She appears well-developed and well-nourished.  HENT:  Head: Normocephalic and atraumatic.  Mouth/Throat: Oropharynx is clear and moist.  Eyes: Right eye exhibits no discharge. Left eye exhibits no discharge.  Neck: Normal range of motion. Neck supple.  Cardiovascular: Normal rate and regular rhythm.  Pulmonary/Chest: Effort normal and breath sounds normal. No stridor. No respiratory distress. She has no wheezes.  Neurological: She is oriented to person, place, and time.  Skin: Skin is warm and dry. She is not diaphoretic.   Psychiatric: She has a normal mood and affect.  Nursing note and vitals reviewed.    ED Treatments / Results  Labs (all labs ordered are listed, but only abnormal results are displayed) Labs Reviewed - No data to display  EKG  EKG Interpretation None       Radiology Dg Chest 2 View  Result Date: 04/10/2017 CLINICAL DATA:  37 year old female with a history of cough and sneezing EXAM: CHEST - 2 VIEW COMPARISON:  12/28/2016 FINDINGS: Cardiomediastinal silhouette unchanged in size and contour. No evidence of central vascular congestion. No pneumothorax or pleural effusion. No acute displaced fracture IMPRESSION: No radiographic evidence of acute cardiopulmonary disease Electronically Signed   By: Gilmer Mor D.O.   On: 04/10/2017 10:49    Procedures Procedures (including critical care time)  Medications Ordered in ED Medications  acetaminophen (TYLENOL) tablet 650 mg (650 mg Oral Given 04/10/17 0934)  ibuprofen (ADVIL,MOTRIN) tablet 800 mg (800 mg Oral Given 04/10/17 1127)     Initial Impression / Assessment and Plan / ED Course  I have reviewed the triage vital signs and the nursing notes.  Pertinent labs & imaging results that were available during my care of the patient were reviewed by me and considered in my medical decision making (see chart for details).    37 year old female presenting with "flulike symptoms".  Patient says she feels like she got hit by a bus.  Patient reports that she has body aches, cough, congestion, fever, fatigue.  Patient reports that she is been getting sick on and off for the last several weeks.    12:24 PM Patient appears very comfortable on exam.  Will treat for flu.  Long discussion had about Tamiflu risks and benefits.  Patient does not want this medication at this time.  We will have her take ibuprofen and Tylenol, drink fluids to stay hydrated at home.  Final Clinical Impressions(s) / ED Diagnoses   Final diagnoses:  Influenza     ED Discharge Orders    None       Finnigan Warriner, Cindee Salt, MD 04/10/17 1224

## 2017-04-10 NOTE — ED Triage Notes (Signed)
Pt complaint of congestion, cough, headache, and body aches onset 2 days ago.

## 2017-04-10 NOTE — ED Notes (Signed)
Patient transported to X-ray 

## 2017-04-10 NOTE — ED Notes (Signed)
ED Provider at bedside. 

## 2017-05-08 ENCOUNTER — Encounter (HOSPITAL_COMMUNITY): Payer: Self-pay | Admitting: Emergency Medicine

## 2017-05-08 ENCOUNTER — Emergency Department (HOSPITAL_COMMUNITY)
Admission: EM | Admit: 2017-05-08 | Discharge: 2017-05-08 | Disposition: A | Discharge status: Home / Self Care | Payer: Self-pay | Attending: Emergency Medicine | Admitting: Emergency Medicine

## 2017-05-08 ENCOUNTER — Emergency Department (HOSPITAL_COMMUNITY): Payer: Self-pay

## 2017-05-08 DIAGNOSIS — R0789 Other chest pain: Secondary | ICD-10-CM | POA: Insufficient documentation

## 2017-05-08 DIAGNOSIS — R070 Pain in throat: Secondary | ICD-10-CM | POA: Insufficient documentation

## 2017-05-08 DIAGNOSIS — R4702 Dysphasia: Secondary | ICD-10-CM | POA: Insufficient documentation

## 2017-05-08 DIAGNOSIS — J029 Acute pharyngitis, unspecified: Secondary | ICD-10-CM | POA: Insufficient documentation

## 2017-05-08 DIAGNOSIS — R0982 Postnasal drip: Secondary | ICD-10-CM | POA: Insufficient documentation

## 2017-05-08 DIAGNOSIS — H9390 Unspecified disorder of ear, unspecified ear: Secondary | ICD-10-CM | POA: Insufficient documentation

## 2017-05-08 DIAGNOSIS — J40 Bronchitis, not specified as acute or chronic: Secondary | ICD-10-CM | POA: Insufficient documentation

## 2017-05-08 DIAGNOSIS — R0602 Shortness of breath: Secondary | ICD-10-CM

## 2017-05-08 DIAGNOSIS — R0981 Nasal congestion: Secondary | ICD-10-CM | POA: Insufficient documentation

## 2017-05-08 DIAGNOSIS — R9389 Abnormal findings on diagnostic imaging of other specified body structures: Secondary | ICD-10-CM | POA: Insufficient documentation

## 2017-05-08 LAB — BASIC METABOLIC PANEL
Anion gap: 9 (ref 5–15)
BUN: 11 mg/dL (ref 6–20)
CHLORIDE: 108 mmol/L (ref 101–111)
CO2: 24 mmol/L (ref 22–32)
Calcium: 8.7 mg/dL — ABNORMAL LOW (ref 8.9–10.3)
Creatinine, Ser: 0.68 mg/dL (ref 0.44–1.00)
GFR calc Af Amer: >60 mL/min (ref >60)
GFR calc non Af Amer: >60 mL/min (ref >60)
GLUCOSE: 107 mg/dL — AB (ref 65–99)
Potassium: 3.3 mmol/L — ABNORMAL LOW (ref 3.5–5.1)
Sodium: 141 mmol/L (ref 135–145)

## 2017-05-08 LAB — CBC WITH DIFFERENTIAL/PLATELET
Basophils Absolute: 0 10*3/uL (ref 0.0–0.1)
Basophils Relative: 0 %
EOS ABS: 0.3 10*3/uL (ref 0.0–0.7)
Eosinophils Relative: 2 %
HEMATOCRIT: 35 % — AB (ref 36.0–46.0)
HEMOGLOBIN: 10.5 g/dL — AB (ref 12.0–15.0)
LYMPHS ABS: 3.7 10*3/uL (ref 0.7–4.0)
Lymphocytes Relative: 33 %
MCH: 21.7 pg — AB (ref 26.0–34.0)
MCHC: 30 g/dL (ref 30.0–36.0)
MCV: 72.5 fL — ABNORMAL LOW (ref 78.0–100.0)
Monocytes Absolute: 0.6 10*3/uL (ref 0.1–1.0)
Monocytes Relative: 5 %
NEUTROS ABS: 6.8 10*3/uL (ref 1.7–7.7)
NEUTROS PCT: 60 %
Platelets: 318 10*3/uL (ref 150–400)
RBC: 4.83 MIL/uL (ref 3.87–5.11)
RDW: 17.8 % — ABNORMAL HIGH (ref 11.5–15.5)
WBC: 11.3 10*3/uL — AB (ref 4.0–10.5)

## 2017-05-08 LAB — I-STAT TROPONIN, ED: TROPONIN I, POC: 0 ng/mL (ref 0.00–0.08)

## 2017-05-08 LAB — GROUP A STREP BY PCR: Group A Strep by PCR: NOT DETECTED

## 2017-05-08 LAB — BRAIN NATRIURETIC PEPTIDE: B Natriuretic Peptide: 16.6 pg/mL (ref 0.0–100.0)

## 2017-05-08 MED ORDER — NAPROXEN 375 MG PO TABS: 375.0000 mg | ORAL_TABLET | Freq: Two times a day (BID) | ORAL | 0 refills | Status: DC

## 2017-05-08 MED ORDER — GI COCKTAIL ~~LOC~~
30.0000 mL | Freq: Once | ORAL | Status: AC
  Administered 2017-05-08: 30 mL via ORAL
  Filled 2017-05-08: qty 30

## 2017-05-08 MED ORDER — IPRATROPIUM-ALBUTEROL 0.5-2.5 (3) MG/3ML IN SOLN
3.0000 mL | Freq: Once | RESPIRATORY_TRACT | Status: AC
Start: 2017-05-08 — End: 2017-05-08
  Administered 2017-05-08: 3 mL via RESPIRATORY_TRACT
  Filled 2017-05-08: qty 3

## 2017-05-08 MED ORDER — PREDNISONE 20 MG PO TABS: 40.0000 mg | ORAL_TABLET | Freq: Every day | ORAL | 0 refills | Status: DC

## 2017-05-08 MED ORDER — LIDOCAINE VISCOUS 2 % MT SOLN: 15.0000 mL | OROMUCOSAL | 0 refills | Status: DC | PRN

## 2017-05-08 MED ORDER — PREDNISONE 20 MG PO TABS
60.0000 mg | ORAL_TABLET | Freq: Once | ORAL | Status: AC
  Administered 2017-05-08: 60 mg via ORAL
  Filled 2017-05-08: qty 3

## 2017-05-08 MED ORDER — ALBUTEROL SULFATE HFA 108 (90 BASE) MCG/ACT IN AERS
2.0000 | INHALATION_SPRAY | Freq: Once | RESPIRATORY_TRACT | Status: AC
  Administered 2017-05-08: 2 via RESPIRATORY_TRACT
  Filled 2017-05-08: qty 6.7

## 2017-05-08 NOTE — ED Triage Notes (Signed)
Pt c/o SOB since Saturday. Sore throat since Monday as well as congestion, sinus drainage. Reports taking Zyrtec.  Patient able to speak in full sentences without any distress. Reports that she recently had PNA.

## 2017-05-08 NOTE — ED Notes (Signed)
Pt ambulated around the department and pulse ox remained between 97% and 100%.

## 2017-05-08 NOTE — ED Provider Notes (Signed)
[facility]-EMERGENCY DEPT Provider Note   CSN: [id number] Arrival date & time: 05/08/17  1727     History   Chief Complaint Chief Complaint  Patient presents with  . Shortness of Breath  . Sore Throat    HPI Tamara Rowland is a 37 y.o. female with a history of obesity who presents the emergency department today for sore throat, URI symptoms and shortness of breath.   Patient reports that on Saturday, 05/04/2017, she began having URI symptoms that made her believe she was getting an "infection".  Patient reports that her symptoms began with sore throat with associated dysphasia, mouth pain and ear fullness.  She notes that the symptoms have progressed her throat has become more painful whenever she swallows.  The patient reports that she has now begun to have mild nasal congestion as well as rhinorrhea.  She feels she may have a small amount of postnasal drip.  She is tried over-the-counter medications as well as salt water rinses for his symptoms without any relief.  The patient denies any sick contacts. Denies fever, chills, inability to control secretions, N/V, abdominal pain, cough, congestion, voice change, dental disease or trauma.   Patient states that on Monday, 05/06/17 she began having shortness of breath.  She reports that she awoke with this.  She states that since that time she has had intermittent episodes at rest where she feels the wind is taken away from her and she has a "pressure, like something is sitting on my chest".  The patient reports that over the last several days her shortness of breath has become worse.  She states that she used to be able to walk up a flight of stairs without becoming short of breath but now is unable to.  She states she has been told in the past she has cardiomegaly but is never been worked up for this.  The patient denies any associated fever, chills, weight loss, cough, tobacco abuse, history of CHF, asthma or COPD.  She denies  any recent illnesses except for flulike illness on 04/10/2017.  Patient denies chest pain radiating to her back, jaw, neck or shoulder.  She states her chest heaviness is not worsened by exertion, only her shortness of breath.  She states her chest heaviness is not associate with nausea, emesis or diaphoresis.  No family history of heart disease.  No prior echocardiogram, stress test or heart catheterization. She denies any associated exogenous estrogen use, recent surgery or travel, recent immobilization, smoking, previous blood clot, history of cancer, lower extremity pain or swelling or history of clotting disorder.  HPI  Past Medical History:  Diagnosis Date  . Anemia 2003   ongoing, has had heavy periods, iron deficiency  . Dysmenorrhea   . Nexplanon in place 01/2012  . Obesity     Patient Active Problem List   Diagnosis Date Noted  . Dysmenorrhea 08/15/2015  . Anemia, iron deficiency 08/15/2015  . Nexplanon in place 08/15/2015  . Obesity 08/15/2015  . Routine general medical examination at a health care facility 08/15/2015  . Screening for condition 08/15/2015  . Urinary frequency 08/15/2015  . Family history of thyroid disease 08/15/2015  . Vaginal discharge 08/15/2015    Past Surgical History:  Procedure Laterality Date  . DILATION AND CURETTAGE OF UTERUS  2013  . INSERTION OF CONTRACEPTIVE CAPSULE  01/2012   nexplanon     OB History   None      Home Medications    Prior  to Admission medications   Medication Sig Start Date End Date Taking? Authorizing Provider  albuterol (PROVENTIL HFA;VENTOLIN HFA) 108 (90 Base) MCG/ACT inhaler Inhale 1-2 puffs into the lungs every 6 (six) hours as needed for wheezing. Patient not taking: Reported on 04/10/2017 12/28/16   Garlon HatchetSanders, Lisa M, PA-C  benzonatate (TESSALON) 100 MG capsule Take 1 capsule (100 mg total) by mouth every 8 (eight) hours. Patient not taking: Reported on 04/10/2017 12/28/16   Garlon HatchetSanders, Lisa M, PA-C  phentermine  37.5 MG capsule Take 1 capsule (37.5 mg total) by mouth every morning. Patient not taking: Reported on 04/10/2017 09/16/15   Tysinger, Kermit Baloavid S, PA-C    Family History Family History  Problem Relation Age of Onset  . Hypertension Mother   . Obesity Mother   . Diabetes Maternal Aunt   . Cancer Maternal Grandmother        stomach  . Diabetes Maternal Grandmother   . Cancer Other        stomach, cervical, bone, prostate, distant relatives  . Diabetes Maternal Aunt   . Stroke Neg Hx   . Heart disease Neg Hx     Social History Social History   Tobacco Use  . Smoking status: Never Smoker  . Smokeless tobacco: Never Used  Substance Use Topics  . Alcohol use: No    Alcohol/week: 0.0 oz  . Drug use: No     Allergies   Patient has no known allergies.   Review of Systems Review of Systems  All other systems reviewed and are negative.    Physical Exam Updated Vital Signs BP (!) 151/67 (BP Location: Right Wrist)   Pulse 95   Temp 98.3 F (36.8 C) (Oral)   Resp 18   LMP 04/30/2017   SpO2 100%   Physical Exam  Constitutional: She appears well-developed and well-nourished.  Obese female in no acute distress.  HENT:  Head: Normocephalic and atraumatic.  Right Ear: External ear normal.  Left Ear: External ear normal.  Nose: Nose normal.  Mouth/Throat: Uvula is midline, oropharynx is clear and moist and mucous membranes are normal. No tonsillar exudate.  The patient has normal phonation and is in control of secretions. No stridor.  Midline uvula without edema. Soft palate rises symmetrically.  1+ tonsils with erythema bilaterally without exudates. No PTA. Tongue protrusion is normal. No trismus. No creptius on neck palpation and patient has good dentition. No gingival erythema or fluctuance noted. Mucus membranes moist.   Eyes: Pupils are equal, round, and reactive to light. Right eye exhibits no discharge. Left eye exhibits no discharge. No scleral icterus.  Neck: Trachea  normal. Neck supple. No spinous process tenderness present. No neck rigidity. Normal range of motion present.  Cardiovascular: Normal rate, regular rhythm and intact distal pulses.  No murmur heard. Pulses:      Radial pulses are 2+ on the right side, and 2+ on the left side.       Dorsalis pedis pulses are 2+ on the right side, and 2+ on the left side.       Posterior tibial pulses are 2+ on the right side, and 2+ on the left side.  No lower extremity swelling or edema. Calves symmetric in size bilaterally.  Pulmonary/Chest: Effort normal. She has wheezes (faint). She exhibits no tenderness.  No increased work of breathing. No accessory muscle use. Patient is sitting upright, speaking in full sentences without difficulty   Abdominal: Soft. Bowel sounds are normal. There is no tenderness. There is  no rebound and no guarding.  Musculoskeletal: She exhibits no edema.  Lymphadenopathy:    She has no cervical adenopathy.  Neurological: She is alert.  Skin: Skin is warm and dry. No rash noted. She is not diaphoretic.  Psychiatric: She has a normal mood and affect.  Nursing note and vitals reviewed.    ED Treatments / Results  Labs (all labs ordered are listed, but only abnormal results are displayed) Labs Reviewed  BASIC METABOLIC PANEL - Abnormal; Notable for the following components:      Result Value   Potassium 3.3 (*)    Glucose, Bld 107 (*)    Calcium 8.7 (*)    All other components within normal limits  CBC WITH DIFFERENTIAL/PLATELET - Abnormal; Notable for the following components:   WBC 11.3 (*)    Hemoglobin 10.5 (*)    HCT 35.0 (*)    MCV 72.5 (*)    MCH 21.7 (*)    RDW 17.8 (*)    All other components within normal limits  GROUP A STREP BY PCR  BRAIN NATRIURETIC PEPTIDE  I-STAT TROPONIN, ED    EKG EKG Interpretation  Date/Time:  Wednesday May 08 2017 17:36:20 EDT Ventricular Rate:  94 PR Interval:    QRS Duration: 80 QT Interval:  347 QTC  Calculation: 434 R Axis:   55 Text Interpretation:  Sinus rhythm Low voltage, precordial leads No old tracing to compare Confirmed by Linwood Dibbles [phone]) on 05/08/2017 5:49:12 PM   Radiology Dg Chest 2 View  Result Date: 05/08/2017 CLINICAL DATA:  Shortness of breath EXAM: CHEST - 2 VIEW COMPARISON:  04/10/2017 FINDINGS: Mild cardiomegaly with vascular congestion. No focal consolidation or effusion. No pneumothorax. IMPRESSION: Borderline to mild cardiomegaly with slight central congestion. Electronically Signed   By: Jasmine Pang M.D.   On: 05/08/2017 18:52    Procedures Procedures (including critical care time)  Medications Ordered in ED Medications  gi cocktail (Maalox,Lidocaine,Donnatal) (has no administration in time range)  ipratropium-albuterol (DUONEB) 0.5-2.5 (3) MG/3ML nebulizer solution 3 mL (3 mLs Nebulization Given 05/08/17 1932)     Initial Impression / Assessment and Plan / ED Course  I have reviewed the triage vital signs and the nursing notes.  Pertinent labs & imaging results that were available during my care of the patient were reviewed by me and considered in my medical decision making (see chart for details).     37 y.o. female with a history of obesity who presents the emergency department today for sore throat, URI symptoms and shortness of breath.   Sore Throat Patient states since 4/6 she has had sore throat with associated dysphasia. Pt afebrile without tonsillar exudate, negative strep. Presents with mild cervical lymphadenopathy, & dysphagia; diagnosis of viral pharyngitis. No abx indicated. Discharged with symptomatic tx for pain  Pt does not appear dehydrated, but did discuss importance of water rehydration. Presentation non concerning for PTA or RPA. No trismus or uvula deviation. Specific return precautions discussed. Pt able to drink water in ED without difficulty with intact air way. Recommended PCP follow up.  SOB Patient reports that since Monday,  after developing viral URI symptoms she has become more short of breath.  She states that she has intermittent episodes that last a few seconds at rest and also with mild exertion as if the wound is taken away from her.  She also reports some pressure across her chest.  She states that she has been told in the past she had some mild  cardiomegaly so been worked up for this.  She denies any history of CHF, asthma or COPD.  Patient is a never smoker.  No personal history of hypertension, hyperlipidemia or diabetes.  No prior MI or CVA.  No prior echocardiogram, stress test or heart catheterization.  No family history of heart disease.  She is a never smoker.  On exam the patient has normal work of breathing.  She is satting greater than 95% without supplemental oxygen.  There is no increased work of breathing.  She has faint end expiratory wheezing.  Suspect patient's symptoms are likely related to a viral bronchitis.  Will give breathing treatment.  Will obtain screening chest x-ray given patient's reported shortness of breath.  Screening chest x-ray shows that the patient has borderline to mild cardiomegaly. Patient does not appear to be fluid overloaded on exam. She is sating at >95% on room air, with no decreased breath sounds. However will work up.  EKG normal sinus rhythm.  No ST changes.  QT/QTc within normal limits.  Patient does have a mild leukocytosis of 11.3.  Suspect this is related to the patient's viral illness.  Mild anemia near patient's baseline.  Troponin 0.00.  Do not suspect ACS.  Patient's HEART score low risk.  Patient is WELLS and PERC negative.  She is without tachycardia or hypoxia.  Do not suspect PE.  Patient's BNP 16.6.  Patient was able to ambulate in the department while maintaining greater than 95% while on room air and did not become short of breath.  Given she does not appear to be fluid overloaded and reassuring workup, did not feel the patient needs ongoing emergent evaluation or  requires admission at this time.  She does report relief after breathing treatment.  I will treat the patient for a viral bronchitis.  I will urged the patient to follow-up with her primary care provider in regards to chest x-ray findings for further workup.   The evaluation does not show pathology that would require ongoing emergent intervention or inpatient treatment. I advised the patient to follow-up with PCP this week. I advised the patient to return to the emergency department with new or worsening symptoms or new concerns. Specific return precautions discussed. The patient verbalized understanding and agreement with plan. All questions answered. No further questions at this time. The patient is hemodynamically stable, mentating appropriately and appears safe for discharge.  Final Clinical Impressions(s) / ED Diagnoses   Final diagnoses:  Viral pharyngitis  Bronchitis  Shortness of breath  Abnormal chest x-ray    ED Discharge Orders        Ordered    predniSONE (DELTASONE) 20 MG tablet  Daily with breakfast     05/08/17 2131    lidocaine (XYLOCAINE) 2 % solution  As needed     05/08/17 2131    naproxen (NAPROSYN) 375 MG tablet  2 times daily     05/08/17 2131       Princella Pellegrini 05/08/17 2206    Linwood Dibbles, MD 05/08/17 2350

## 2017-05-08 NOTE — Discharge Instructions (Signed)
Your chest x-ray showed that you have a mildly enlarged heart.  Your workup today including blood work and EKG were reassuring.  I would like you to follow-up with your primary care provider this week for further evaluation.  Please read and follow all provided instructions.  Your diagnoses today include: Viral Pharyngitis and Bronchitis (see attached handout on Bronchitis)  Tests performed today include: Vital signs. See below for your results today.  Strep Test: This was negative. A throat culture was sent as a precaution and results will be available in 2-3 days. If it returns positive for strep, you will be called by our flow manager for further instructions. Chest xray Blood work  Medications prescribed:  1.  Viscous lidocaine-swish, gargle and spit as needed for sore throat. 2.  Naproxen -take twice daily with food as needed for pain. 3.  Prednisone- please use to help decrease inflammation and this will also help with the bronchitis.  Please note this medication can increase blood sugars.  If you are diabetic, please monitor your blood sugars at home.  This medication can cause weight gain, increased hunger and poor sleep. 4. Albuterol inhaler - this medication will help open up your airway. Use inhaler as follows: 1-2 puffs with spacer every 4 hours as needed for wheezing, cough, or shortness of breath.    Home care instructions:  At this time, it appears that your sore throat is caused by a viral infection. Antibiotics do NOT help a viral infection and can cause unwanted side effects. The fever should resolve in 2-3 days and sore throat should begin to resolve in 2-3 days as well. Continued to alternate between Tylenol and ibuprofen for pain. May consider over-the-counter Benadryl for additional relief (decrease secretions). Also discard your toothbrush and begin using a new one in 3 days.   Follow-up instructions: Please follow-up with your primary care provider in 2-3 days for  follow up.   Return instructions:  Return to the ED sooner for worsening condition, inability to swallow, breathing difficulty, new concerns.  Additional Information: Your chest x-ray showed that you have a mildly enlarged heart.  Your workup today including blood work and EKG were reassuring.  I would like you to follow-up with your primary care provider this week for further evaluation.  Your vital signs today were: BP (!) 151/67 (BP Location: Right Wrist)    Pulse 92    Temp 98.3 F (36.8 C) (Oral)    Resp 18    LMP 04/30/2017    SpO2 99%  If your blood pressure (BP) was elevated above 135/85 this visit, please have this repeated by your doctor within one month. ---------------

## 2017-08-27 ENCOUNTER — Telehealth (HOSPITAL_COMMUNITY): Payer: Self-pay | Admitting: Emergency Medicine

## 2017-08-27 ENCOUNTER — Ambulatory Visit (HOSPITAL_COMMUNITY)
Admission: EM | Admit: 2017-08-27 | Discharge: 2017-08-27 | Disposition: A | Discharge status: Home / Self Care | Payer: BLUE CROSS/BLUE SHIELD | Attending: Internal Medicine | Admitting: Internal Medicine

## 2017-08-27 ENCOUNTER — Encounter: Payer: Self-pay | Admitting: Emergency Medicine

## 2017-08-27 DIAGNOSIS — R350 Frequency of micturition: Secondary | ICD-10-CM | POA: Diagnosis not present

## 2017-08-27 DIAGNOSIS — Z8249 Family history of ischemic heart disease and other diseases of the circulatory system: Secondary | ICD-10-CM | POA: Diagnosis not present

## 2017-08-27 DIAGNOSIS — N898 Other specified noninflammatory disorders of vagina: Secondary | ICD-10-CM | POA: Diagnosis present

## 2017-08-27 DIAGNOSIS — R3915 Urgency of urination: Secondary | ICD-10-CM | POA: Diagnosis not present

## 2017-08-27 DIAGNOSIS — Z3202 Encounter for pregnancy test, result negative: Secondary | ICD-10-CM | POA: Diagnosis not present

## 2017-08-27 LAB — POCT URINALYSIS DIP (DEVICE)
BILIRUBIN URINE: NEGATIVE
GLUCOSE, UA: NEGATIVE mg/dL
KETONES UR: NEGATIVE mg/dL
Leukocytes, UA: NEGATIVE
Nitrite: NEGATIVE
PROTEIN: NEGATIVE mg/dL
SPECIFIC GRAVITY, URINE: 1.02 (ref 1.005–1.030)
Urobilinogen, UA: 0.2 mg/dL (ref 0.0–1.0)
pH: 7 (ref 5.0–8.0)

## 2017-08-27 LAB — POCT PREGNANCY, URINE: PREG TEST UR: NEGATIVE

## 2017-08-27 MED ORDER — METRONIDAZOLE 0.75 % VA GEL: 1.0000 | Freq: Every day | VAGINAL | 0 refills | Status: AC

## 2017-08-27 MED ORDER — FLUCONAZOLE 150 MG PO TABS: 150.0000 mg | ORAL_TABLET | Freq: Every day | ORAL | 0 refills | Status: DC

## 2017-08-27 NOTE — ED Triage Notes (Signed)
PT reports urinary frequency with vaginal odor.

## 2017-08-27 NOTE — ED Triage Notes (Signed)
PT has had symptoms for 2 weeks.

## 2017-08-27 NOTE — ED Provider Notes (Signed)
[facility]    CSN: [id number] Arrival date & time: 08/27/17  1653     History   Chief Complaint Chief Complaint  Patient presents with  . Urinary Tract Infection  . Vaginal Discharge    HPI Tamara Rowland is a 37 y.o. female.   37 year old female comes in for 2-week history of urinary symptoms and vaginal odor.  States has had urinary frequency, urgency without dysuria.  Has also had vaginal odor that is fishy in nature, no obvious vaginal discharge or itching.  Denies abdominal pain, nausea, vomiting.  Denies fever, chills, night sweats.  LMP 07/10/2017, did have some spotting earlier this month, without a full cycle.  Sexually active with one female partner.     Past Medical History:  Diagnosis Date  . Anemia 2003   ongoing, has had heavy periods, iron deficiency  . Dysmenorrhea   . Nexplanon in place 01/2012  . Obesity     Patient Active Problem List   Diagnosis Date Noted  . Dysmenorrhea 08/15/2015  . Anemia, iron deficiency 08/15/2015  . Nexplanon in place 08/15/2015  . Obesity 08/15/2015  . Routine general medical examination at a health care facility 08/15/2015  . Screening for condition 08/15/2015  . Urinary frequency 08/15/2015  . Family history of thyroid disease 08/15/2015  . Vaginal discharge 08/15/2015    Past Surgical History:  Procedure Laterality Date  . DILATION AND CURETTAGE OF UTERUS  2013  . INSERTION OF CONTRACEPTIVE CAPSULE  01/2012   nexplanon    OB History   None      Home Medications    Prior to Admission medications   Medication Sig Start Date End Date Taking? Authorizing Provider  fluconazole (DIFLUCAN) 150 MG tablet Take 1 tablet (150 mg total) by mouth daily. Take second dose 72 hours later if symptoms still persists. 08/27/17   Cathie Hoops, Vaeda Westall V, PA-C  lidocaine (XYLOCAINE) 2 % solution Use as directed 15 mLs in the mouth or throat as needed for mouth pain. 05/08/17   Maczis, Elmer Sow, PA-C  metroNIDAZOLE (METROGEL VAGINAL)  0.75 % vaginal gel Place 1 Applicatorful vaginally at bedtime for 5 days. 08/27/17 09/01/17  Cathie Hoops, [place] Kirchner V, PA-C  naproxen (NAPROSYN) 375 MG tablet Take 1 tablet (375 mg total) by mouth 2 (two) times daily. 05/08/17   Maczis, Elmer Sow, PA-C    Family History Family History  Problem Relation Age of Onset  . Hypertension Mother   . Obesity Mother   . Diabetes Maternal Aunt   . Cancer Maternal Grandmother        stomach  . Diabetes Maternal Grandmother   . Cancer Other        stomach, cervical, bone, prostate, distant relatives  . Diabetes Maternal Aunt   . Stroke Neg Hx   . Heart disease Neg Hx     Social History Social History   Tobacco Use  . Smoking status: Never Smoker  . Smokeless tobacco: Never Used  Substance Use Topics  . Alcohol use: No    Alcohol/week: 0.0 oz  . Drug use: No     Allergies   Patient has no known allergies.   Review of Systems Review of Systems  Reason unable to perform ROS: See HPI as above.     Physical Exam Triage Vital Signs ED Triage Vitals  Enc Vitals Group     BP 08/27/17 1731 (!) 141/80     Pulse Rate 08/27/17 1730 84     Resp 08/27/17 1730  16     Temp 08/27/17 1730 98.6 F (37 C)     Temp Source 08/27/17 1730 Oral     SpO2 08/27/17 1730 100 %     Weight --      Height --      Head Circumference --      Peak Flow --      Pain Score 08/27/17 1731 0     Pain Loc --      Pain Edu? --      Excl. in GC? --    No data found.  Updated Vital Signs BP (!) 141/80   Pulse 84   Temp 98.6 F (37 C) (Oral)   Resp 16   LMP 07/10/2017   SpO2 100%   Physical Exam  Constitutional: She is oriented to person, place, and time. She appears well-developed and well-nourished. No distress.  HENT:  Head: Normocephalic and atraumatic.  Eyes: Pupils are equal, round, and reactive to light. Conjunctivae are normal.  Cardiovascular: Normal rate, regular rhythm and normal heart sounds. Exam reveals no gallop and no friction rub.  No murmur  heard. Pulmonary/Chest: Effort normal and breath sounds normal. She has no wheezes. She has no rales.  Abdominal: Soft. Bowel sounds are normal. She exhibits no mass. There is no tenderness. There is no rebound, no guarding and no CVA tenderness.  Neurological: She is alert and oriented to person, place, and time.  Skin: Skin is warm and dry.  Psychiatric: She has a normal mood and affect. Her behavior is normal. Judgment normal.     UC Treatments / Results  Labs (all labs ordered are listed, but only abnormal results are displayed) Labs Reviewed  POCT URINALYSIS DIP (DEVICE) - Abnormal; Notable for the following components:      Result Value   Hgb urine dipstick SMALL (*)    All other components within normal limits  POCT PREGNANCY, URINE  CERVICOVAGINAL ANCILLARY ONLY    EKG None  Radiology No results found.  Procedures Procedures (including critical care time)  Medications Ordered in UC Medications - No data to display  Initial Impression / Assessment and Plan / UC Course  I have reviewed the triage vital signs and the nursing notes.  Pertinent labs & imaging results that were available during my care of the patient were reviewed by me and considered in my medical decision making (see chart for details).    Urine negative for UTI. Patient was treated empirically for BV. metrogel as directed. Diflucan also called into pharmacy, patient can start if having yeast symptoms.  Cytology sent, patient will be contacted with any positive results that require additional treatment. Patient to refrain from sexual activity for the next 7 days. Return precautions given.   Final Clinical Impressions(s) / UC Diagnoses   Final diagnoses:  Vaginal discharge    ED Prescriptions    Medication Sig Dispense Auth. Provider   metroNIDAZOLE (METROGEL VAGINAL) 0.75 % vaginal gel Place 1 Applicatorful vaginally at bedtime for 5 days. 50 g Oluwatamilore Starnes V, PA-C   fluconazole (DIFLUCAN) 150 MG  tablet Take 1 tablet (150 mg total) by mouth daily. Take second dose 72 hours later if symptoms still persists. 2 tablet Threasa AlphaYu, Wynell Halberg V, PA-C        Keiarra Charon V, PA-C 08/27/17 [phone]

## 2017-08-27 NOTE — Telephone Encounter (Signed)
Pharmacy called to switch metro gel to pills; ok per A Cathie HoopsYu

## 2017-08-27 NOTE — Discharge Instructions (Signed)
You were treated empirically for BV. Start metrogel as directed. Diflucan as needed if experiencing yeast infection symptoms such as vaginal itching, clumpy/cottage cheese like discharge. Cytology sent, you will be contacted with any positive results that requires further treatment. Refrain from sexual activity and alcohol use for the next 7 days. Monitor for any worsening of symptoms, fever, abdominal pain, nausea, vomiting, to follow up for reevaluation.  No urinary tract infection today. Monitor alcohol, caffeine intake. Follow up with PCP for further evaluation if continues with urinary frequency.

## 2017-08-28 LAB — CERVICOVAGINAL ANCILLARY ONLY
Bacterial vaginitis: POSITIVE — AB
CANDIDA VAGINITIS: NEGATIVE
CHLAMYDIA, DNA PROBE: NEGATIVE
Neisseria Gonorrhea: NEGATIVE
Trichomonas: NEGATIVE

## 2018-03-31 ENCOUNTER — Other Ambulatory Visit: Payer: Self-pay

## 2018-03-31 ENCOUNTER — Emergency Department (HOSPITAL_COMMUNITY)
Admission: EM | Admit: 2018-03-31 | Discharge: 2018-03-31 | Disposition: A | Discharge status: Home / Self Care | Payer: PRIVATE HEALTH INSURANCE | Attending: Emergency Medicine | Admitting: Emergency Medicine

## 2018-03-31 DIAGNOSIS — M62838 Other muscle spasm: Secondary | ICD-10-CM | POA: Insufficient documentation

## 2018-03-31 DIAGNOSIS — M549 Dorsalgia, unspecified: Secondary | ICD-10-CM | POA: Diagnosis present

## 2018-03-31 DIAGNOSIS — M62830 Muscle spasm of back: Secondary | ICD-10-CM

## 2018-03-31 MED ORDER — DIAZEPAM 5 MG PO TABS: 5.0000 mg | ORAL_TABLET | Freq: Two times a day (BID) | ORAL | 0 refills | Status: DC | PRN

## 2018-03-31 MED ORDER — DIAZEPAM 5 MG PO TABS
5.0000 mg | ORAL_TABLET | Freq: Once | ORAL | Status: AC
  Administered 2018-03-31: 5 mg via ORAL
  Filled 2018-03-31: qty 1

## 2018-03-31 MED ORDER — ACETAMINOPHEN 500 MG PO TABS
1000.0000 mg | ORAL_TABLET | Freq: Once | ORAL | Status: AC
  Administered 2018-03-31: 1000 mg via ORAL
  Filled 2018-03-31: qty 2

## 2018-03-31 MED ORDER — IBUPROFEN 800 MG PO TABS: 800.0000 mg | ORAL_TABLET | Freq: Three times a day (TID) | ORAL | 0 refills | Status: DC

## 2018-03-31 MED ORDER — KETOROLAC TROMETHAMINE 60 MG/2ML IM SOLN
60.0000 mg | Freq: Once | INTRAMUSCULAR | Status: AC
  Administered 2018-03-31: 60 mg via INTRAMUSCULAR
  Filled 2018-03-31: qty 2

## 2018-03-31 NOTE — Discharge Instructions (Signed)
Take the prescribed medication as directed.  Can continue using heat on the back to help relax muscles-- warm compress, heating pad, warm shower/bath, etc. Follow-up with your primary care doctor. Return to the ED for new or worsening symptoms.

## 2018-03-31 NOTE — ED Provider Notes (Signed)
[facility]-EMERGENCY DEPT Provider Note   CSN: [id number] Arrival date & time: 03/31/18  6295    History   Chief Complaint Chief Complaint  Patient presents with  . Back Pain    HPI Tamara Rowland is a 38 y.o. female.     The history is provided by the patient and medical records.  Back Pain    38 y.o. F with history of anemia, dysmenorrhea, obesity, presenting to the ED for right upper back pain.  States she woke from sleep early this morning and felt a lot of pain and tightness in her right upper back.  States she took some aleve at home and tried to go back to sleep, but unable to get comfortable.  She was given some Tylenol in triage which seemed to help a little bit but still has a lot of tightness in her upper back.  She denies any numbness or weakness of her arms or legs.  She denies any recent trauma, falls, or other potential injuries.  Past Medical History:  Diagnosis Date  . Anemia 2003   ongoing, has had heavy periods, iron deficiency  . Dysmenorrhea   . Nexplanon in place 01/2012  . Obesity     Patient Active Problem List   Diagnosis Date Noted  . Dysmenorrhea 08/15/2015  . Anemia, iron deficiency 08/15/2015  . Nexplanon in place 08/15/2015  . Obesity 08/15/2015  . Routine general medical examination at a health care facility 08/15/2015  . Screening for condition 08/15/2015  . Urinary frequency 08/15/2015  . Family history of thyroid disease 08/15/2015  . Vaginal discharge 08/15/2015    Past Surgical History:  Procedure Laterality Date  . DILATION AND CURETTAGE OF UTERUS  2013  . INSERTION OF CONTRACEPTIVE CAPSULE  01/2012   nexplanon     OB History   No obstetric history on file.      Home Medications    Prior to Admission medications   Medication Sig Start Date End Date Taking? Authorizing Provider  fluconazole (DIFLUCAN) 150 MG tablet Take 1 tablet (150 mg total) by mouth daily. Take second dose 72 hours later if  symptoms still persists. 08/27/17   Cathie Hoops, Amy V, PA-C  lidocaine (XYLOCAINE) 2 % solution Use as directed 15 mLs in the mouth or throat as needed for mouth pain. 05/08/17   Maczis, Elmer Sow, PA-C  naproxen (NAPROSYN) 375 MG tablet Take 1 tablet (375 mg total) by mouth 2 (two) times daily. 05/08/17   Maczis, Elmer Sow, PA-C    Family History Family History  Problem Relation Age of Onset  . Hypertension Mother   . Obesity Mother   . Diabetes Maternal Aunt   . Cancer Maternal Grandmother        stomach  . Diabetes Maternal Grandmother   . Cancer Other        stomach, cervical, bone, prostate, distant relatives  . Diabetes Maternal Aunt   . Stroke Neg Hx   . Heart disease Neg Hx     Social History Social History   Tobacco Use  . Smoking status: Never Smoker  . Smokeless tobacco: Never Used  Substance Use Topics  . Alcohol use: No    Alcohol/week: 0.0 standard drinks  . Drug use: No     Allergies   Patient has no known allergies.   Review of Systems Review of Systems  Musculoskeletal: Positive for back pain.  All other systems reviewed and are negative.    Physical Exam Updated  Vital Signs BP 138/66 (BP Location: Left Arm)   Pulse 85   Temp 98.9 F (37.2 C) (Oral)   Resp 20   Ht 5\' 2"  (1.575 m)   Wt 136.1 kg   LMP 03/15/2018 (Approximate)   SpO2 99%   BMI 54.87 kg/m   Physical Exam Vitals signs and nursing note reviewed.  Constitutional:      Appearance: She is well-developed.     Comments: Morbidly obese  HENT:     Head: Normocephalic and atraumatic.  Eyes:     Conjunctiva/sclera: Conjunctivae normal.     Pupils: Pupils are equal, round, and reactive to light.  Neck:     Musculoskeletal: Normal range of motion.  Cardiovascular:     Rate and Rhythm: Normal rate and regular rhythm.     Heart sounds: Normal heart sounds.  Pulmonary:     Effort: Pulmonary effort is normal.     Breath sounds: Normal breath sounds.  Abdominal:     General: Bowel sounds  are normal.     Palpations: Abdomen is soft.  Musculoskeletal: Normal range of motion.     Comments: Tenderness noted to right upper back spanning across the shoulder blade and into right trapezius, there is no significant spasm noted, pain exacerbated with movement of the right arm, normal strength and sensation of right arm, normal grips, normal distal sensation and perfusion  Skin:    General: Skin is warm and dry.  Neurological:     Mental Status: She is alert and oriented to person, place, and time.      ED Treatments / Results  Labs (all labs ordered are listed, but only abnormal results are displayed) Labs Reviewed - No data to display  EKG None  Radiology No results found.  Procedures Procedures (including critical care time)  Medications Ordered in ED Medications  acetaminophen (TYLENOL) tablet 1,000 mg (1,000 mg Oral Given 03/31/18 0352)  ketorolac (TORADOL) injection 60 mg (60 mg Intramuscular Given 03/31/18 0551)  diazepam (VALIUM) tablet 5 mg (5 mg Oral Given 03/31/18 0551)     Initial Impression / Assessment and Plan / ED Course  I have reviewed the triage vital signs and the nursing notes.  Pertinent labs & imaging results that were available during my care of the patient were reviewed by me and considered in my medical decision making (see chart for details).  38 year old female who with right upper back pain and tightness upon waking this morning.  She denies any injury, trauma, or falls.  Tenderness noted of right thoracic paraspinal musculature extending into the trapezius.  Pain is elicited with any movement of the right arm.  She has no focal neurologic deficits, right arm remains neurovascularly intact.  Suspect muscle spasm, possibly from awkward sleeping position.  Patient treated here with improvement.  Do not feel she needs emergent imaging.  Discharge home with continued symptomatic care.  Close follow-up with PCP.  Return here for any new or acute  changes.  Final Clinical Impressions(s) / ED Diagnoses   Final diagnoses:  Muscle spasm of back    ED Discharge Orders         Ordered    diazepam (VALIUM) 5 MG tablet  2 times daily PRN     03/31/18 0623    ibuprofen (ADVIL,MOTRIN) 800 MG tablet  3 times daily     03/31/18 2620           Garlon Hatchet, PA-C 03/31/18 3559    Jaci Carrel  J, MD 03/31/18 [phone]

## 2018-03-31 NOTE — ED Triage Notes (Addendum)
Pt reports right sided back and rib pain that she felt when she woke up. Pt report increased pain with inhalations.

## 2018-07-09 NOTE — Progress Notes (Deleted)
   Subjective:    Patient ID: Tamara Rowland, female    DOB: 1980/02/17, 38 y.o.   MRN: [id number]  HPI No chief complaint on file.  She is new to the practice and here for a complete physical exam. Previous medical care: Last CPE:  Other providers:  Past medical history: Surgeries:  Family history: Mental Health History:  Social history: Lives with ***, works as ***, *** Smoking, drinking alcohol, drug use  Diet: *** Excerise: ***  Immunizations:  Health maintenance:  Mammogram: Colonoscopy: Last Gynecological Exam: Last Menstrual cycle: Pregnancies:  Last Dental Exam: Last Eye Exam:  Wears seatbelt always, uses sunscreen, smoke detectors in home and functioning, does not text while driving and feels safe in home environment.   Reviewed allergies, medications, past medical, surgical, family, and social history.   Review of Systems Review of Systems Constitutional: -fever, -chills, -sweats, -unexpected weight change,-fatigue ENT: -runny nose, -ear pain, -sore throat Cardiology:  -chest pain, -palpitations, -edema Respiratory: -cough, -shortness of breath, -wheezing Gastroenterology: -abdominal pain, -nausea, -vomiting, -diarrhea, -constipation  Hematology: -bleeding or bruising problems Musculoskeletal: -arthralgias, -myalgias, -joint swelling, -back pain Ophthalmology: -vision changes Urology: -dysuria, -difficulty urinating, -hematuria, -urinary frequency, -urgency Neurology: -headache, -weakness, -tingling, -numbness       Objective:   Physical Exam There were no vitals taken for this visit.  General Appearance:    Alert, cooperative, no distress, appears stated age  Head:    Normocephalic, without obvious abnormality, atraumatic  Eyes:    PERRL, conjunctiva/corneas clear, EOM's intact, fundi    benign  Ears:    Normal TM's and external ear canals  Nose:   Nares normal, mucosa normal, no drainage or sinus   tenderness  Throat:   Lips, mucosa, and  tongue normal; teeth and gums normal  Neck:   Supple, no lymphadenopathy;  thyroid:  no   enlargement/tenderness/nodules; no carotid   bruit or JVD  Back:    Spine nontender, no curvature, ROM normal, no CVA     tenderness  Lungs:     Clear to auscultation bilaterally without wheezes, rales or     ronchi; respirations unlabored  Chest Wall:    No tenderness or deformity   Heart:    Regular rate and rhythm, S1 and S2 normal, no murmur, rub   or gallop  Breast Exam:    No tenderness, masses, or nipple discharge or inversion.      No axillary lymphadenopathy  Abdomen:     Soft, non-tender, nondistended, normoactive bowel sounds,    no masses, no hepatosplenomegaly  Genitalia:    Normal external genitalia without lesions.  BUS and vagina normal; cervix without lesions, or cervical motion tenderness. No abnormal vaginal discharge.  Uterus and adnexa not enlarged, nontender, no masses.  Pap performed  Rectal:    Not performed due to age<40 and no related complaints  Extremities:   No clubbing, cyanosis or edema  Pulses:   2+ and symmetric all extremities  Skin:   Skin color, texture, turgor normal, no rashes or lesions  Lymph nodes:   Cervical, supraclavicular, and axillary nodes normal  Neurologic:   CNII-XII intact, normal strength, sensation and gait; reflexes 2+ and symmetric throughout          Psych:   Normal mood, affect, hygiene and grooming.         Assessment & Plan:  Routine general medical examination at a health care facility

## 2018-07-10 ENCOUNTER — Ambulatory Visit: Payer: Self-pay | Admitting: Family Medicine

## 2018-07-16 ENCOUNTER — Encounter: Payer: Self-pay | Admitting: Family Medicine

## 2018-07-16 ENCOUNTER — Ambulatory Visit: Payer: PRIVATE HEALTH INSURANCE | Admitting: Family Medicine

## 2018-07-16 ENCOUNTER — Other Ambulatory Visit: Payer: Self-pay

## 2018-07-16 VITALS — BP 120/78 | HR 74 | Temp 97.8°F | Ht 62.0 in | Wt 367.0 lb

## 2018-07-16 DIAGNOSIS — Z13220 Encounter for screening for lipoid disorders: Secondary | ICD-10-CM | POA: Diagnosis not present

## 2018-07-16 DIAGNOSIS — Z0000 Encounter for general adult medical examination without abnormal findings: Secondary | ICD-10-CM

## 2018-07-16 DIAGNOSIS — D509 Iron deficiency anemia, unspecified: Secondary | ICD-10-CM

## 2018-07-16 DIAGNOSIS — M549 Dorsalgia, unspecified: Secondary | ICD-10-CM | POA: Diagnosis not present

## 2018-07-16 DIAGNOSIS — Z975 Presence of (intrauterine) contraceptive device: Secondary | ICD-10-CM

## 2018-07-16 DIAGNOSIS — N62 Hypertrophy of breast: Secondary | ICD-10-CM | POA: Insufficient documentation

## 2018-07-16 DIAGNOSIS — R7303 Prediabetes: Secondary | ICD-10-CM

## 2018-07-16 DIAGNOSIS — G8929 Other chronic pain: Secondary | ICD-10-CM | POA: Insufficient documentation

## 2018-07-16 LAB — LIPID PANEL

## 2018-07-16 LAB — POCT GLYCOSYLATED HEMOGLOBIN (HGB A1C): Hemoglobin A1C: 6.2 % — AB (ref 4.0–5.6)

## 2018-07-16 NOTE — Patient Instructions (Addendum)
Obgyn Offices:   [facility] [address] [place], [place] [phone]  [facility] Address: [address] [place] [address] [place], [place] [address] Phone: [phone]  [facility] [address] [place], [place] [address] Phone: [phone]  [place], [place] [address] Phone: [phone]    You can call and schedule with a plastic surgeon to discuss breast reduction.   [facility]: Address: [address] [place], [place] [address] Phone: [phone]  We will follow up pending your labs results.      Preventive Care 18-39 Years, Female Preventive care refers to lifestyle choices and visits with your health care provider that can promote health and wellness. What does preventive care include?   A yearly physical exam. This is also called an annual well check.  Dental exams once or twice a year.  Routine eye exams. Ask your health care provider how often you should have your eyes checked.  Personal lifestyle choices, including: ? Daily care of your teeth and gums. ? Regular physical activity. ? Eating a healthy diet. ? Avoiding tobacco and drug use. ? Limiting alcohol use. ? Practicing safe sex. ? Taking vitamin and mineral supplements as recommended by your health care provider. What happens during an annual well check? The services and screenings done by your health care provider during your annual well check will depend on your age, overall health, lifestyle risk factors, and family history of disease. Counseling Your health care provider may ask you questions about your:  Alcohol use.  Tobacco use.  Drug use.  Emotional well-being.  Home and relationship well-being.  Sexual activity.  Eating habits.  Work and work Statistician.  Method of birth control.  Menstrual cycle.  Pregnancy  history. Screening You may have the following tests or measurements:  Height, weight, and BMI.  Diabetes screening. This is done by checking your blood sugar (glucose) after you have not eaten for a while (fasting).  Blood pressure.  Lipid and cholesterol levels. These may be checked every 5 years starting at age 75.  Skin check.  Hepatitis C blood test.  Hepatitis B blood test.  Sexually transmitted disease (STD) testing.  BRCA-related cancer screening. This may be done if you have a family history of breast, ovarian, tubal, or peritoneal cancers.  Pelvic exam and Pap test. This may be done every 3 years starting at age 40. Starting at age 61, this may be done every 5 years if you have a Pap test in combination with an HPV test. Discuss your test results, treatment options, and if necessary, the need for more tests with your health care provider. Vaccines Your health care provider may recommend certain vaccines, such as:  Influenza vaccine. This is recommended every year.  Tetanus, diphtheria, and acellular pertussis (Tdap, Td) vaccine. You may need a Td booster every 10 years.  Varicella vaccine. You may need this if you have not been vaccinated.  HPV vaccine. If you are 59 or younger, you may need three doses over 6 months.  Measles, mumps, and rubella (MMR) vaccine. You may need at least one dose of MMR. You may also need a second dose.  Pneumococcal 13-valent conjugate (PCV13) vaccine. You may need this if you have certain conditions and were not previously vaccinated.  Pneumococcal polysaccharide (PPSV23) vaccine. You may need one or two doses if you smoke cigarettes or if you have certain  conditions.  Meningococcal vaccine. One dose is recommended if you are age 67-21 years and a first-year college student living in a residence hall, or if you have one of several medical conditions. You may also need additional booster doses.  Hepatitis A vaccine. You may need this  if you have certain conditions or if you travel or work in places where you may be exposed to hepatitis A.  Hepatitis B vaccine. You may need this if you have certain conditions or if you travel or work in places where you may be exposed to hepatitis B.  Haemophilus influenzae type b (Hib) vaccine. You may need this if you have certain risk factors. Talk to your health care provider about which screenings and vaccines you need and how often you need them. This information is not intended to replace advice given to you by your health care provider. Make sure you discuss any questions you have with your health care provider. Document Released: 03/13/2001 Document Revised: 08/28/2016 Document Reviewed: 11/16/2014 Elsevier Interactive Patient Education  2019 [employer].

## 2018-07-16 NOTE — Progress Notes (Signed)
Subjective:    Patient ID: Tamara Rowland, female    DOB: Feb 21, 1980, 38 y.o.   MRN: [id number]  HPI Chief Complaint  Patient presents with  . new pt    new pt, cpe, non fasting, wants to be tested for dm, last visit she was pre-diabetes. back pain, weight loss, pap smear has not been done in years.    She is new to the practice and here for a complete physical exam. Previous medical care: at least 5 years since she has had any regular care. States she typically goes to [facility].  Last CPE: years ago   States she has been a patient of [facility] in the past.  States she wants to have weight loss surgery. Plans to contact them and go back. States she backed out right before the last surgery.   History of prediabetes- last Hgb A1c was 2 years ago.  Complains of urinary frequency, increased thirst and blurred vision. States she was tested for a UTI and it was negative. Worried that she may have diabetes.   Complains of chronic upper back pain that feels like an ache. Getting worse. States she has been told that her pain is due to her large breasts. States she would like to have a breast reduction.  Reports taking ibuprofen and tylenol daily for past several months for this pain and also using icy hot.   States she has a history of anemia.  Is not taking an iron supplement.   Nexplanon that is overdue to be removed.  Would like a referral to OB/GYN.   Social history: Lives with her female spouse, Has a daughter who is 21 years old. 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 Regular cycles.  Last Dental Exam: last year  Last Eye Exam: years ago   Wears seatbelt always, smoke detectors in home and functioning, does not text while driving and feels safe  in home environment.   Reviewed allergies, medications, past medical, surgical, family, and social history.   Review of Systems Review of Systems Constitutional: -fever, -chills, -sweats, -unexpected weight change,-fatigue ENT: -runny nose, -ear pain, -sore throat Cardiology:  -chest pain, -palpitations, -edema Respiratory: -cough, -shortness of breath, -wheezing Gastroenterology: -abdominal pain, -nausea, -vomiting, -diarrhea, -constipation  Hematology: -bleeding or bruising problems Musculoskeletal: -arthralgias, -myalgias, -joint swelling, +upper back pain Ophthalmology: -vision changes Urology: -dysuria, -difficulty urinating, -hematuria, +urinary frequency, -urgency Neurology: -headache, -weakness, -tingling, -numbness       Objective:   Physical Exam BP 120/78   Pulse 74   Temp 97.8 F (36.6 C) (Oral)   Ht 5\' 2"  (1.575 m)   Wt (!) 367 lb (166.5 kg)   LMP 07/06/2018   SpO2 98%   BMI 67.13 kg/m   General Appearance:    Alert, cooperative, no distress, appears stated age, morbidly obese   Head:    Normocephalic, without obvious abnormality, atraumatic  Eyes:    PERRL, conjunctiva/corneas clear, EOM's intact, fundi    benign  Ears:    Normal TM's and external ear canals  Nose:   Nares normal, mucosa normal, no drainage or sinus   tenderness  Throat:   Lips, mucosa, and tongue normal; teeth and gums normal  Neck:   Supple, no lymphadenopathy;  thyroid:  no  enlargement/tenderness/nodules; no carotid   bruit or JVD  Back:    Spine nontender, no curvature, ROM normal, no CVA     tenderness  Lungs:     Clear to auscultation bilaterally without wheezes, rales or     ronchi; respirations unlabored  Chest Wall:    No tenderness or deformity   Heart:    Regular rate and rhythm. Difficult to auscultate heart sounds due to body habitus.   Breast Exam:    Referral to OB/GYN   Abdomen:     Soft, non-tender, normoactive bowel sounds. Unable to palpate any masses but difficult to  assess due to body habitus.      Genitalia:    Referral to OB/GYN  Rectal:    Not performed due to age<40 and no related complaints  Extremities:   No clubbing, cyanosis or edema  Pulses:   2+ and symmetric all extremities  Skin:   Skin color, texture, turgor normal, no rashes or lesions  Lymph nodes:   Cervical, supraclavicular, and axillary nodes normal  Neurologic:   CNII-XII intact, normal strength, sensation and gait; reflexes 2+ and symmetric throughout          Psych:   Normal mood, affect, hygiene and grooming.         Assessment & Plan:  Routine general medical examination at a health care facility - Plan: CBC with Differential/Platelet, Comprehensive metabolic panel, TSH, T4, free, T3, Lipid panel. She will schedule with OB/GYN for pap smear and removal of nexplanon. UTD on Tdap.   Prediabetes - Plan: HgB A1c. Hgb A1c is 6.2%. counseling on cutting back on sugar and carbohydrates. Weight loss will help.   Morbid obesity (HCC) - Plan: she will call and schedule with [facility] since she has been a patient there in the past. She desires gastric sleeve surgery.   Upper back pain, chronic - Plan: she feels this is due to large breasts and would like to have a breast reduction.   Iron deficiency anemia, unspecified iron deficiency anemia type - Plan: Iron, TIBC and Ferritin Panel. Discussed getting iron in her diet. She may need oral iron.   Nexplanon in place - Plan: overdue for removal. She is aware that this is no longer effective for birth control. Referral to OB/GYN   Screening for lipid disorders - Plan: Lipid panel   Breast hypertrophy in female - Plan: she will call plastic surgery for consultation.   Follow up pending labs.

## 2018-07-17 LAB — CBC WITH DIFFERENTIAL/PLATELET
Basophils Absolute: 0 10*3/uL (ref 0.0–0.2)
Basos: 0 %
EOS (ABSOLUTE): 0.1 10*3/uL (ref 0.0–0.4)
Eos: 1 %
Hematocrit: 33.9 % — ABNORMAL LOW (ref 34.0–46.6)
Hemoglobin: 10.5 g/dL — ABNORMAL LOW (ref 11.1–15.9)
Immature Grans (Abs): 0.1 10*3/uL (ref 0.0–0.1)
Immature Granulocytes: 1 %
Lymphocytes Absolute: 2.5 10*3/uL (ref 0.7–3.1)
Lymphs: 25 %
MCH: 21.5 pg — ABNORMAL LOW (ref 26.6–33.0)
MCHC: 31 g/dL — ABNORMAL LOW (ref 31.5–35.7)
MCV: 69 fL — ABNORMAL LOW (ref 79–97)
Monocytes Absolute: 0.5 10*3/uL (ref 0.1–0.9)
Monocytes: 5 %
Neutrophils Absolute: 6.9 10*3/uL (ref 1.4–7.0)
Neutrophils: 68 %
Platelets: 304 10*3/uL (ref 150–450)
RBC: 4.89 x10E6/uL (ref 3.77–5.28)
RDW: 17.3 % — ABNORMAL HIGH (ref 11.7–15.4)
WBC: 10.1 10*3/uL (ref 3.4–10.8)

## 2018-07-17 LAB — LIPID PANEL
Chol/HDL Ratio: 2.9 ratio (ref 0.0–4.4)
Cholesterol, Total: 136 mg/dL (ref 100–199)
HDL: 47 mg/dL (ref >39)
LDL Calculated: 76 mg/dL (ref 0–99)
Triglycerides: 63 mg/dL (ref 0–149)
VLDL Cholesterol Cal: 13 mg/dL (ref 5–40)

## 2018-07-17 LAB — COMPREHENSIVE METABOLIC PANEL
ALT: 13 IU/L (ref 0–32)
AST: 9 IU/L (ref 0–40)
Albumin/Globulin Ratio: 1.3 (ref 1.2–2.2)
Albumin: 3.8 g/dL (ref 3.8–4.8)
Alkaline Phosphatase: 76 IU/L (ref 39–117)
BUN/Creatinine Ratio: 13 (ref 9–23)
BUN: 9 mg/dL (ref 6–20)
Bilirubin Total: 0.3 mg/dL (ref 0.0–1.2)
CO2: 25 mmol/L (ref 20–29)
Calcium: 9 mg/dL (ref 8.7–10.2)
Chloride: 104 mmol/L (ref 96–106)
Creatinine, Ser: 0.72 mg/dL (ref 0.57–1.00)
GFR calc Af Amer: 125 mL/min/{1.73_m2} (ref >59)
GFR calc non Af Amer: 108 mL/min/{1.73_m2} (ref >59)
Globulin, Total: 3 g/dL (ref 1.5–4.5)
Glucose: 106 mg/dL — ABNORMAL HIGH (ref 65–99)
Potassium: 3.6 mmol/L (ref 3.5–5.2)
Sodium: 141 mmol/L (ref 134–144)
Total Protein: 6.8 g/dL (ref 6.0–8.5)

## 2018-07-17 LAB — T3: T3, Total: 140 ng/dL (ref 71–180)

## 2018-07-17 LAB — IRON,TIBC AND FERRITIN PANEL
Ferritin: 47 ng/mL (ref 15–150)
Iron Saturation: 10 % — ABNORMAL LOW (ref 15–55)
Iron: 33 ug/dL (ref 27–159)
Total Iron Binding Capacity: 329 ug/dL (ref 250–450)
UIBC: 296 ug/dL (ref 131–425)

## 2018-07-17 LAB — T4, FREE: Free T4: 1.03 ng/dL (ref 0.82–1.77)

## 2018-07-17 LAB — TSH: TSH: 2.94 u[IU]/mL (ref 0.450–4.500)

## 2018-08-27 ENCOUNTER — Ambulatory Visit: Payer: PRIVATE HEALTH INSURANCE | Admitting: Medical

## 2018-09-10 ENCOUNTER — Ambulatory Visit: Payer: PRIVATE HEALTH INSURANCE | Admitting: Family Medicine

## 2018-09-10 ENCOUNTER — Encounter: Payer: Self-pay | Admitting: Family Medicine

## 2018-09-10 ENCOUNTER — Other Ambulatory Visit: Payer: Self-pay

## 2018-09-10 VITALS — Ht 62.0 in | Wt 350.0 lb

## 2018-09-10 DIAGNOSIS — R350 Frequency of micturition: Secondary | ICD-10-CM

## 2018-09-10 DIAGNOSIS — R102 Pelvic and perineal pain: Secondary | ICD-10-CM

## 2018-09-10 DIAGNOSIS — N3000 Acute cystitis without hematuria: Secondary | ICD-10-CM

## 2018-09-10 DIAGNOSIS — M545 Low back pain, unspecified: Secondary | ICD-10-CM

## 2018-09-10 DIAGNOSIS — R8290 Unspecified abnormal findings in urine: Secondary | ICD-10-CM

## 2018-09-10 MED ORDER — FLUCONAZOLE 150 MG PO TABS: 150.0000 mg | ORAL_TABLET | Freq: Once | ORAL | 0 refills | Status: AC

## 2018-09-10 MED ORDER — SULFAMETHOXAZOLE-TRIMETHOPRIM 800-160 MG PO TABS: 1.0000 | ORAL_TABLET | Freq: Two times a day (BID) | ORAL | 0 refills | Status: DC

## 2018-09-10 NOTE — Progress Notes (Signed)
   Subjective:   Documentation for virtual audio and video telecommunications through [place] encounter:  The patient was located at home. 2 patient identifiers used.  The provider was located in the office. The patient did consent to this visit and is aware of possible charges through their insurance for this visit.  The other persons participating in this telemedicine service were none.    Patient ID: Tamara Rowland, female    DOB: [birth year], 38 y.o.   MRN: [id number]  HPI Chief Complaint  Patient presents with  . Urinary Tract Infection    lower back and abdominal pain, frequent urination x5 days    Complains of 5-6 day history of urinary frequency, low back ache, lower abdominal pressure. States her  urine is cloudy and has a bad odor.   States it feels like a UTI in the past.   Last UTI in 2 months. Never had problem with recurrent UTIs or pyelonephritis.   LMP: late but no sperm exposure, she has a female spouse  Nexplanon is still in place.   Requests Diflucan due to getting a yeast infection with antibiotics.  Denies fever, chills, dizziness, chest pain, palpitations, shortness of breath, abdominal pain, N/V/D.   Reviewed allergies, medications, past medical, surgical, family, and social history.   Review of Systems Pertinent positives and negatives in the history of present illness.     Objective:   Physical Exam Ht 5\' 2"  (1.575 m)   Wt (!) 350 lb (158.8 kg)   BMI 64.02 kg/m   Alert and oriented and in no acute distress.  Respirations unlabored.  Normal speech, mood and thought process.  Unable to further examine due to virtual visit.       Assessment & Plan:  Acute cystitis without hematuria - Plan: sulfamethoxazole-trimethoprim (BACTRIM DS) 800-160 MG tablet, no red flag symptoms.  No recurrent UTI history.  Does not appear toxic.  Treat empirically for infection and follow-up if worsening or not back to baseline after completing the antibiotic.  Advised  to increase water intake  Urinary frequency - Plan: Most likely due to UTI.  She does have prediabetes and we are monitoring.  Discussed that if this does not improve with the antibiotic that she should be seen for repeat blood sugar check  Acute bilateral low back pain without sciatica - Plan: May take Tylenol or ibuprofen.  Most likely related to UTI.  She will let me know if this persists or worsens  Suprapubic pressure -   Cloudy urine -  She denies the possibility of pregnancy due to having a female partner.  She is aware that the antibiotic and Diflucan are not safe in pregnancy.  She will follow-up if worsening or if not back to baseline after completing the antibiotic.  Time spent on call was 15 minutes and in review of previous records 2 minutes total.  This virtual service is not related to other E/M service within previous 7 days.

## 2018-10-27 ENCOUNTER — Other Ambulatory Visit: Payer: Self-pay

## 2018-10-27 ENCOUNTER — Encounter: Payer: Self-pay | Admitting: Family Medicine

## 2018-10-27 ENCOUNTER — Ambulatory Visit: Payer: PRIVATE HEALTH INSURANCE | Admitting: Family Medicine

## 2018-10-27 VITALS — BP 122/80 | HR 87 | Wt 371.4 lb

## 2018-10-27 DIAGNOSIS — N3001 Acute cystitis with hematuria: Secondary | ICD-10-CM

## 2018-10-27 DIAGNOSIS — R7303 Prediabetes: Secondary | ICD-10-CM | POA: Insufficient documentation

## 2018-10-27 DIAGNOSIS — R350 Frequency of micturition: Secondary | ICD-10-CM

## 2018-10-27 LAB — POCT GLYCOSYLATED HEMOGLOBIN (HGB A1C): Hemoglobin A1C: 6 % — AB (ref 4.0–5.6)

## 2018-10-27 LAB — GLUCOSE, POCT (MANUAL RESULT ENTRY): POC Glucose: 110 mg/dl — AB (ref 70–99)

## 2018-10-27 LAB — POCT URINALYSIS DIP (PROADVANTAGE DEVICE)
Bilirubin, UA: NEGATIVE
Glucose, UA: NEGATIVE mg/dL
Ketones, POC UA: NEGATIVE mg/dL
Leukocytes, UA: NEGATIVE
Nitrite, UA: NEGATIVE
Protein Ur, POC: NEGATIVE mg/dL
Specific Gravity, Urine: 1.015
Urobilinogen, Ur: NEGATIVE
pH, UA: 7.5 (ref 5.0–8.0)

## 2018-10-27 MED ORDER — NITROFURANTOIN MONOHYD MACRO 100 MG PO CAPS: 100.0000 mg | ORAL_CAPSULE | Freq: Two times a day (BID) | ORAL | 0 refills | Status: DC

## 2018-10-27 MED ORDER — METFORMIN HCL 500 MG PO TABS: 500.0000 mg | ORAL_TABLET | Freq: Two times a day (BID) | ORAL | 1 refills | Status: DC

## 2018-10-27 MED ORDER — FLUCONAZOLE 150 MG PO TABS: 150.0000 mg | ORAL_TABLET | Freq: Once | ORAL | 0 refills | Status: AC

## 2018-10-27 NOTE — Patient Instructions (Signed)
I recommend you return in 2 weeks unless on your period for a repeat urine check. You had blood in your urine today and we need to make sure this clears.

## 2018-10-27 NOTE — Progress Notes (Signed)
Subjective:  Tamara Rowland is a 38 y.o. female who complains of possible urinary tract infection.  She has had symptoms for 1 week.  Symptoms include urinary frequency. Patient denies fever, chills, chest pain, abdominal pain, back pain, N/V/D.  Last UTI was last month. States her symptoms never completely resolved but she did not let me know. States she took the full course of Bactrim.   Using nothing for current symptoms.     LMP: 3rd week in August  states she has a Actuary - which is old. Has appt to get it removed by Dr Nevada Crane. Plans to get a new one.   Denies any chance of pregnancy or STD.  Refuses STD testing.  States she is not sexually active.   She would also like to discuss weight loss. States she is becoming depressed due to not being able to lose weight. Has tried medication in the past including Phentermine.  Would like to start on Metformin.   She has been seeing [facility] for weight loss.  States she has not met her deductable. Approved for breast reduction but could not afford.  Going to try again next year.  History of prediabetes.  Hgb A1c 6.2% in June 2020.  States she does not feel that she eats a lot of calories.  She does not drink water. States she drinks sugary drinks.    ROS as in subjective  Reviewed allergies, medications, past medical, surgical, and social history.    Objective: Vitals:   10/27/18 1518  BP: 122/80  Pulse: 87  SpO2: 96%    General appearance: alert, no distress, WD/WN, female Abdomen: +bs, soft, non tender, non distended, no masses, no hepatomegaly, no splenomegaly, no bruits Back: no CVA tenderness GU: declines      PCOT glucose fasting 110 Hgb A1c 6.0%  Laboratory:  Urine dipstick: 1+ for hemoglobin.       Assessment: Acute cystitis with hematuria - Plan: POCT Urinalysis DIP (Proadvantage Device), Urine Culture, nitrofurantoin, macrocrystal-monohydrate, (MACROBID) 100 MG capsule  Urinary  frequency - Plan: POCT Urinalysis DIP (Proadvantage Device), Urine Culture, POCT glycosylated hemoglobin (Hb A1C), POCT glucose (manual entry)  Morbid obesity (HCC) - Plan: Amb Ref to Medical Weight Management  Prediabetes - Plan: POCT glycosylated hemoglobin (Hb A1C), POCT glucose (manual entry), metFORMIN (GLUCOPHAGE) 500 MG tablet, Amb Ref to Medical Weight Management   Plan: Discussed symptoms, diagnosis, possible complications, and usual course of illness.  Begin Macrobid as discussed.   Advised increased water intake, can use OTC Tylenol for pain.     Urine culture sent.   Will have her return in 2 weeks if not on cycle to recheck UA to ensure resolution of hematuria.  Referral to [facility]- she seems very motivated to lose weight and is becoming depressed feeling like there is no way she can do this. Cannot currently afford bariatric surgery or breast reduction.  I will start her on Metformin to help control her blood sugars. Counseling on cutting back on sugars and carbohydrates. Cut out beverages with sugar.   Call or return if worse or not improving.  Return for lab visit in 2 weeks for repeat UA.

## 2018-10-28 LAB — URINE CULTURE

## 2018-10-31 ENCOUNTER — Ambulatory Visit: Payer: PRIVATE HEALTH INSURANCE | Admitting: Family Medicine

## 2018-10-31 ENCOUNTER — Encounter: Payer: Self-pay | Admitting: Family Medicine

## 2018-10-31 ENCOUNTER — Ambulatory Visit (HOSPITAL_COMMUNITY): Payer: PRIVATE HEALTH INSURANCE

## 2018-10-31 ENCOUNTER — Other Ambulatory Visit: Payer: Self-pay

## 2018-10-31 VITALS — BP 112/80 | HR 72 | Wt 370.0 lb

## 2018-10-31 DIAGNOSIS — R102 Pelvic and perineal pain: Secondary | ICD-10-CM

## 2018-10-31 DIAGNOSIS — M545 Low back pain, unspecified: Secondary | ICD-10-CM

## 2018-10-31 DIAGNOSIS — R319 Hematuria, unspecified: Secondary | ICD-10-CM

## 2018-10-31 DIAGNOSIS — R109 Unspecified abdominal pain: Secondary | ICD-10-CM

## 2018-10-31 DIAGNOSIS — R3915 Urgency of urination: Secondary | ICD-10-CM

## 2018-10-31 DIAGNOSIS — R350 Frequency of micturition: Secondary | ICD-10-CM | POA: Diagnosis not present

## 2018-10-31 DIAGNOSIS — R1030 Lower abdominal pain, unspecified: Secondary | ICD-10-CM

## 2018-10-31 LAB — POCT URINALYSIS DIP (PROADVANTAGE DEVICE)
Glucose, UA: NEGATIVE mg/dL
Ketones, POC UA: NEGATIVE mg/dL
Leukocytes, UA: NEGATIVE
Nitrite, UA: NEGATIVE
Specific Gravity, Urine: 1.025
Urobilinogen, Ur: NEGATIVE
pH, UA: 6 (ref 5.0–8.0)

## 2018-10-31 NOTE — Progress Notes (Signed)
   Subjective:    Patient ID: Tamara Rowland, female    DOB: [birth year], 38 y.o.   MRN: [id number]  HPI Chief Complaint  Patient presents with  . low back pain    low back pain, abdominal pain, urine frequency x 2 days ago. when she was seen recently, she was only having urinary frequency    Here with worsening urinary symptoms and now having low back pain and lower abdominal pain. She has persistent hematuria.  5 days ago I saw her and treated her for presumed cystitis. Urine culture was negative so she stopped the antibiotic. She has not taken anything for her symptoms.   She is not on her menstrual cycle. States she has not had one since our last visit and has history of irregular menses.   She is not sexually active. Has same sex partner.   Denies fever, chills, headache, chest pain, palpitations, shortness of breath, N/V/D.  Reports normal bowel movements.  She is not on anticoagulant.   Reviewed allergies, medications, past medical, surgical, family, and social history.    Review of Systems Pertinent positives and negatives in the history of present illness.     Objective:   Physical Exam Constitutional:      General: She is not in acute distress.    Appearance: She is obese.  Neck:     Musculoskeletal: Normal range of motion and neck supple.  Cardiovascular:     Rate and Rhythm: Normal rate and regular rhythm.     Pulses: Normal pulses.     Heart sounds: Normal heart sounds.  Pulmonary:     Effort: Pulmonary effort is normal.     Breath sounds: Normal breath sounds.  Abdominal:     General: Bowel sounds are normal.     Palpations: Abdomen is soft.     Tenderness: There is abdominal tenderness in the suprapubic area. There is right CVA tenderness and left CVA tenderness. There is no guarding or rebound. Negative signs include Murphy's sign and McBurney's sign.  Neurological:     Mental Status: She is alert and oriented to person, place, and time.     Cranial Nerves:  Cranial nerves are intact.     Sensory: Sensation is intact.     Motor: Motor function is intact.     Gait: Gait is intact.    BP 112/80   Pulse 72   Wt (!) 370 lb (167.8 kg)   BMI 67.67 kg/m       Assessment & Plan:  Hematuria, unspecified type - Plan: CANCELED: CT RENAL STONE STUDY  Urinary frequency - Plan: POCT Urinalysis DIP (Proadvantage Device), POCT urine pregnancy, CANCELED: CT RENAL STONE STUDY  Acute bilateral low back pain without sciatica - Plan: CT Abdomen Pelvis Wo Contrast, CANCELED: CT RENAL STONE STUDY  Suprapubic pressure - Plan: CANCELED: CT RENAL STONE STUDY  Urgency of urination - Plan: CANCELED: CT RENAL STONE STUDY  Lower abdominal pain - Plan: CT Abdomen Pelvis Wo Contrast, CANCELED: CT RENAL STONE STUDY  Abdominal pain, unspecified abdominal location  Negative UPT.  Negative urine culture 5 days ago.  Unclear etiology for hematuria. Consider renal stone or underlying etiology.  Will send her for a CT to rule out.  She may try AZO and Tylenol or ibuprofen and hydrate.  Follow up pending CT results. Consider referral to urology pending results.

## 2018-11-01 LAB — POCT URINE PREGNANCY: Preg Test, Ur: NEGATIVE

## 2018-11-03 ENCOUNTER — Telehealth: Payer: Self-pay | Admitting: Family Medicine

## 2018-11-03 ENCOUNTER — Ambulatory Visit (HOSPITAL_COMMUNITY): Admission: RE | Admit: 2018-11-03 | Payer: PRIVATE HEALTH INSURANCE | Source: Ambulatory Visit

## 2018-11-03 DIAGNOSIS — R350 Frequency of micturition: Secondary | ICD-10-CM

## 2018-11-03 DIAGNOSIS — R3915 Urgency of urination: Secondary | ICD-10-CM

## 2018-11-03 DIAGNOSIS — R319 Hematuria, unspecified: Secondary | ICD-10-CM

## 2018-11-03 NOTE — Telephone Encounter (Signed)
Pt was notified and I have put referral through [facility]

## 2018-11-03 NOTE — Telephone Encounter (Signed)
Hospital called this morning and stated that pt was scheduled for a CT at 8 this morning and did not show up or reschedule

## 2018-11-03 NOTE — Telephone Encounter (Signed)
Called and spoke with patient regarding the fact that she did not show up for her CT Friday or this morning. She states she cannot drink the contrast that is tastes too bad. She is not having any worsening symptoms, now mainly having urinary frequency. She is seeing her OB/GYN this Thursday. Will bring this up at that appointment. Plan to refer her to urology for further evaluation of hematuria and urinary frequency, urgency.  Please put in referral and let her know. Thanks.

## 2018-11-10 ENCOUNTER — Telehealth: Payer: Self-pay | Admitting: Internal Medicine

## 2018-11-10 NOTE — Telephone Encounter (Signed)
She did not report any vaginal discharge or odor at her visit. I did not test her for BV.  I recommend having her OB/GYN test her for this Thursday.

## 2018-11-10 NOTE — Telephone Encounter (Signed)
Pt called and wants to know if you think she has BV. She is still havign back pain, urinary issues and some discharge. She does have an appt this Thursday she thinks with OBGYN. Please advise. Urology referral is being reviewed

## 2018-11-10 NOTE — Telephone Encounter (Signed)
Pt will have obgyn to test her

## 2019-04-10 ENCOUNTER — Ambulatory Visit: Payer: PRIVATE HEALTH INSURANCE | Admitting: Family Medicine

## 2019-04-13 ENCOUNTER — Encounter: Payer: Self-pay | Admitting: Family Medicine

## 2019-07-20 ENCOUNTER — Ambulatory Visit: Payer: PRIVATE HEALTH INSURANCE | Admitting: Medical

## 2019-07-20 ENCOUNTER — Telehealth: Payer: Self-pay | Admitting: Medical

## 2019-07-20 ENCOUNTER — Encounter: Payer: Self-pay | Admitting: Family Medicine

## 2019-07-20 ENCOUNTER — Other Ambulatory Visit: Payer: Self-pay

## 2019-07-20 ENCOUNTER — Encounter: Payer: Self-pay | Admitting: Medical

## 2019-07-20 VITALS — BP 126/80 | HR 80 | Wt 379.6 lb

## 2019-07-20 DIAGNOSIS — R300 Dysuria: Secondary | ICD-10-CM | POA: Diagnosis not present

## 2019-07-20 DIAGNOSIS — R0683 Snoring: Secondary | ICD-10-CM | POA: Diagnosis not present

## 2019-07-20 DIAGNOSIS — E611 Iron deficiency: Secondary | ICD-10-CM

## 2019-07-20 DIAGNOSIS — E559 Vitamin D deficiency, unspecified: Secondary | ICD-10-CM

## 2019-07-20 DIAGNOSIS — R8290 Unspecified abnormal findings in urine: Secondary | ICD-10-CM | POA: Diagnosis not present

## 2019-07-20 DIAGNOSIS — R400 Somnolence: Secondary | ICD-10-CM | POA: Diagnosis not present

## 2019-07-20 DIAGNOSIS — E538 Deficiency of other specified B group vitamins: Secondary | ICD-10-CM

## 2019-07-20 DIAGNOSIS — R0681 Apnea, not elsewhere classified: Secondary | ICD-10-CM

## 2019-07-20 DIAGNOSIS — E66813 Obesity, class 3: Secondary | ICD-10-CM | POA: Insufficient documentation

## 2019-07-20 DIAGNOSIS — E669 Obesity, unspecified: Secondary | ICD-10-CM

## 2019-07-20 DIAGNOSIS — R635 Abnormal weight gain: Secondary | ICD-10-CM

## 2019-07-20 LAB — POCT URINALYSIS DIP (PROADVANTAGE DEVICE)
Bilirubin, UA: NEGATIVE
Glucose, UA: NEGATIVE mg/dL
Ketones, POC UA: NEGATIVE mg/dL
Nitrite, UA: NEGATIVE
Protein Ur, POC: NEGATIVE mg/dL
Specific Gravity, Urine: 1.02
Urobilinogen, Ur: NEGATIVE
pH, UA: 6 (ref 5.0–8.0)

## 2019-07-20 MED ORDER — SULFAMETHOXAZOLE-TRIMETHOPRIM 800-160 MG PO TABS: 1.0000 | ORAL_TABLET | Freq: Two times a day (BID) | ORAL | 0 refills | Status: DC

## 2019-07-20 MED ORDER — FLUCONAZOLE 150 MG PO TABS
ORAL_TABLET | ORAL | 0 refills | Status: DC
Start: 2019-07-20 — End: 2019-08-12

## 2019-07-20 MED ORDER — PRENATAL VITAMINS 28-0.8 MG PO TABS: 1.0000 | ORAL_TABLET | Freq: Every day | ORAL | 5 refills | Status: DC

## 2019-07-20 NOTE — Addendum Note (Signed)
Addended by: Victorio Palm on: 07/20/2019 12:35 PM   Modules accepted: Orders

## 2019-07-20 NOTE — Telephone Encounter (Signed)
Done

## 2019-07-20 NOTE — Telephone Encounter (Signed)
Refer for sleep study

## 2019-07-20 NOTE — Progress Notes (Signed)
Subjective: Chief Complaint  Patient presents with  . Urinary Tract Infection    frequent urination,odor,denies burning and itching, no period in 2 months    Here for possible UTI.  She reports over a week of urinary urgency, urinary frequency, cloudy urine, some odor in the urine.  No blood in the urine.  No fever, no nausea vomiting, no back pain.  She tends to get UTIs every now and then.  Last urinary tract infection was back in the fall 2020.  No vaginal discharge.  No concern for pregnancy.  She has a female partner.  She notes her periods as started become a little irregular, no menstrual In the last 2 months.  No heavy bleeding.  She has been seeing the bariatric clinic for possible surgery.  Surgery has been tentatively scheduled for October.  She had some labs recently that showed some abnormality of thyroid labs, low vitamin D, low folic acid.  She was advised to begin vitamin D and folic acid.  She has not yet started this  She denies polydipsia but does get very thirsty in the night.  She keeps some water beside her bed she does snore loud.  Her partner has noted some witnessed apnea.  She feels fatigued and sleepy during the day.  Her partner is also on sleep apnea machine  Past Medical History:  Diagnosis Date  . Anemia 2003   ongoing, has had heavy periods, iron deficiency  . Dysmenorrhea   . Nexplanon in place 01/2012  . Obesity    Current Outpatient Medications on File Prior to Visit  Medication Sig Dispense Refill  . ergocalciferol (VITAMIN D2) 1.25 MG (50000 UT) capsule Take by mouth.    Marland Kitchen ibuprofen (ADVIL,MOTRIN) 800 MG tablet Take 1 tablet (800 mg total) by mouth 3 (three) times daily. (Patient not taking: Reported on 07/20/2019) 21 tablet 0  . metFORMIN (GLUCOPHAGE) 500 MG tablet Take 1 tablet (500 mg total) by mouth 2 (two) times daily with a meal. (Patient not taking: Reported on 07/20/2019) 60 tablet 1  . nitrofurantoin, macrocrystal-monohydrate, (MACROBID) 100 MG  capsule Take 1 capsule (100 mg total) by mouth 2 (two) times daily. (Patient not taking: Reported on 10/31/2018) 14 capsule 0   No current facility-administered medications on file prior to visit.   ROS as in subjective    Objective: BP 126/80   Pulse 80   Wt (!) 379 lb 9.6 oz (172.2 kg)   SpO2 94%   BMI 69.43 kg/m    Wt Readings from Last 3 Encounters:  07/20/19 (!) 379 lb 9.6 oz (172.2 kg)  10/31/18 (!) 370 lb (167.8 kg)  10/27/18 (!) 371 lb 6.4 oz (168.5 kg)   Gen: wd,wn, nad, obese African-American female Back nontender Abdomen nontender no mass or organomegaly GU deferred   Assessment: Encounter Diagnoses  Name Primary?  . Dysuria Yes  . Abnormal urine odor   . Snoring   . Daytime somnolence   . Witnessed apneic spells   . Vitamin D deficiency   . Folic acid deficiency   . Weight gain   . Obesity with serious comorbidity, unspecified classification, unspecified obesity type      Plan: Dysuria, abnormal urine odor-we will send for culture, begin Bactrim, hydrate well, follow-up worse in the meantime.  Diflucan written in case she gets yeast infection from antibiotic which she says she does in the past  Snoring, daytime somnolence, witnessed apnea-refer for sleep study  Vitamin D deficiency-advise she begin supplement  Folic acid deficiency, iron deficiency-begin prenatal vitamin, counseled on diverse diet, getting good variety of vegetables fruits and lean cuts of meat  Obesity, weight gain-continue follow-up with bariatric clinic, referral for sleep study, work on losing weight through straight diet and exercise   Tamara Rowland was seen today for urinary tract infection.  Diagnoses and all orders for this visit:  Dysuria -     Urine Culture  Abnormal urine odor -     Urine Culture  Snoring  Daytime somnolence  Witnessed apneic spells  Vitamin D deficiency  Folic acid deficiency  Weight gain  Obesity with serious comorbidity, unspecified  classification, unspecified obesity type  Other orders -     sulfamethoxazole-trimethoprim (BACTRIM DS) 800-160 MG tablet; Take 1 tablet by mouth 2 (two) times daily. -     Prenatal Vit-Fe Fumarate-FA (PRENATAL VITAMINS) 28-0.8 MG TABS; Take 1 tablet by mouth daily. -     fluconazole (DIFLUCAN) 150 MG tablet; 1 tablet weekly   F/u pending referral

## 2019-07-21 LAB — URINE CULTURE

## 2019-07-23 ENCOUNTER — Other Ambulatory Visit: Payer: Self-pay

## 2019-07-23 DIAGNOSIS — R0681 Apnea, not elsewhere classified: Secondary | ICD-10-CM

## 2019-07-23 DIAGNOSIS — R0683 Snoring: Secondary | ICD-10-CM

## 2019-08-10 ENCOUNTER — Telehealth: Payer: Self-pay

## 2019-08-10 NOTE — Telephone Encounter (Signed)
Please get her on the schedule for Tuesday preferably or Wednesday.  I need to recheck blood sugar, repeat urine recheck on symptoms of frequent urination    FYI-I spoke to patient.  Her symptoms are unchanged, not worse.  I asked her to make a follow-up appointment.  She was prediabetic last labs

## 2019-08-10 NOTE — Telephone Encounter (Signed)
Pt was seen by you 07/20/19 and the abx she was put on are not working. Please advise [facility]

## 2019-08-11 NOTE — Telephone Encounter (Signed)
Pt scheduled for tomorrow morning 

## 2019-08-12 ENCOUNTER — Encounter: Payer: Self-pay | Admitting: Family Medicine

## 2019-08-12 ENCOUNTER — Ambulatory Visit: Payer: PRIVATE HEALTH INSURANCE | Admitting: Medical

## 2019-08-12 ENCOUNTER — Encounter: Payer: Self-pay | Admitting: Medical

## 2019-08-12 ENCOUNTER — Other Ambulatory Visit: Payer: Self-pay

## 2019-08-12 VITALS — BP 124/84 | HR 85 | Ht 62.0 in | Wt 383.2 lb

## 2019-08-12 DIAGNOSIS — R350 Frequency of micturition: Secondary | ICD-10-CM | POA: Diagnosis not present

## 2019-08-12 DIAGNOSIS — R0609 Other forms of dyspnea: Secondary | ICD-10-CM

## 2019-08-12 DIAGNOSIS — R600 Localized edema: Secondary | ICD-10-CM | POA: Diagnosis not present

## 2019-08-12 DIAGNOSIS — D509 Iron deficiency anemia, unspecified: Secondary | ICD-10-CM

## 2019-08-12 DIAGNOSIS — R0600 Dyspnea, unspecified: Secondary | ICD-10-CM | POA: Diagnosis not present

## 2019-08-12 DIAGNOSIS — R7301 Impaired fasting glucose: Secondary | ICD-10-CM

## 2019-08-12 DIAGNOSIS — Z975 Presence of (intrauterine) contraceptive device: Secondary | ICD-10-CM

## 2019-08-12 LAB — POCT URINALYSIS DIP (PROADVANTAGE DEVICE)
Bilirubin, UA: NEGATIVE
Glucose, UA: NEGATIVE mg/dL
Ketones, POC UA: NEGATIVE mg/dL
Leukocytes, UA: NEGATIVE
Nitrite, UA: NEGATIVE
Protein Ur, POC: NEGATIVE mg/dL
Specific Gravity, Urine: 1.025
Urobilinogen, Ur: NEGATIVE
pH, UA: 6 (ref 5.0–8.0)

## 2019-08-12 NOTE — Progress Notes (Signed)
Subjective: Chief Complaint  Patient presents with   Diabetes    urinating a lot-weight gain not sure how-not eating much    Here for concerns and f/u from last visit   Still having urinary frequency.  Used medication last visit for suspected UTI but culture came back negative.  She notes frequency urination.   Urinates maybe once in the night.   She urinates about 4 times in the morning by 9:30, so several times throughout the day.  No burning, no odor, no cloudy urine, no blood.  Has some back pain.  She notes some squinting but no blurred vision.  She does have some increased thirst.    She was on metformin per gyn but hasn't been taking this.  Has been borderline for diabetes  Feels like she is having some swelling in left lower leg and ankle, asymmetric, mild.  Swelling for a while, months.   No chest pain, no dyspnea.  She notes from last year til now gets SOB with exercise.    No chest pain or palpations.  LMP 3 months ago.   But prior was having frequent bleeding, several months in a row.  Sees gynecology, had D&C prior.   At one point she requested hysterectomy.  No concern for pregnancy, is lesbian.   She is doing sleep study soon, just got the device in mail from our last conversation.  Has nexplanon in place, way overdue to remove nexplanon.  Hasn't been back to gyn for removal.     Past Medical History:  Diagnosis Date   Anemia 2003   ongoing, has had heavy periods, iron deficiency   Dysmenorrhea    Nexplanon in place 01/2012   Obesity    Current Outpatient Medications on File Prior to Visit  Medication Sig Dispense Refill   ergocalciferol (VITAMIN D2) 1.25 MG (50000 UT) capsule Take by mouth.     Prenatal Vit-Fe Fumarate-FA (PRENATAL VITAMINS) 28-0.8 MG TABS Take 1 tablet by mouth daily. (Patient not taking: Reported on 08/12/2019) 30 tablet 5   No current facility-administered medications on file prior to visit.   ROS as in subjective     Objective: BP  124/84    Pulse 85    Ht 5\' 2"  (1.575 m)    Wt (!) 383 lb 3.2 oz (173.8 kg)    SpO2 100%    BMI 70.09 kg/m   Wt Readings from Last 3 Encounters:  08/12/19 (!) 383 lb 3.2 oz (173.8 kg)  07/20/19 (!) 379 lb 9.6 oz (172.2 kg)  10/31/18 (!) 370 lb (167.8 kg)   BP Readings from Last 3 Encounters:  08/12/19 124/84  07/20/19 126/80  10/31/18 112/80   Gen: wd, wn, nad, African American female, obese Lungs clear, no rales or rhonchi Heart regular rhythm, normal S1-S2, no murmur Bilat LE 1+ pitting edema, no calve tendnerss, negative homans 2+ UE and LE pulses Mild lumbar paraspinal tenderness, no CVA tendnerss Abdomen: +bs, soft, mild suprapubic tendnerss, otherwise nontender, no obvious mass but large habitus makes exam somewhat limited   EKG Indication edema, dyspnea on exertion, rate 85 bpm, PR interval 160 ms, QTC 440 ms, axis 66 degrees, nsr, cant rule out anterior infarct, no significant change from 2019 EKG other than more pronounced T wave inversion in III.    Assessment: Encounter Diagnoses  Name Primary?   Urinary frequency Yes   DOE (dyspnea on exertion)    Edema of both lower extremities    Morbid obesity (HCC)  Impaired fasting blood sugar    Iron deficiency anemia, unspecified iron deficiency anemia type    Nexplanon in place      Plan: Urinary frequency - recent urine culture negative, labs today for further eval.  Need to rule out diabetes.  Consider OAB, consider mass effect from ovarian cyst or fibroids?  DOE - labs today, EKG reviewed  Edema - f/u pending labs  Obesity - f/u pending labs for guidance on diet, exercise, weight loss efforts, prior bariatric clinic consult  Impaired glucose - labs today  Iron deficiency - taking supplement prenatal vitamin with iron.   Updated labs today.  A year ago iron was quite low  nexplanon due to be removed - referral back to gynecology  Snoring - c/t plan to complete sleep study  Maghen was seen today for  diabetes.  Diagnoses and all orders for this visit:  Urinary frequency -     Comprehensive metabolic panel -     Hemoglobin A1c -     POCT Urinalysis DIP (Proadvantage Device)  DOE (dyspnea on exertion) -     EKG 12-Lead  Edema of both lower extremities -     Comprehensive metabolic panel -     TSH -     EKG 12-Lead  Morbid obesity (HCC) -     TSH  Impaired fasting blood sugar -     Hemoglobin A1c  Iron deficiency anemia, unspecified iron deficiency anemia type -     Iron -     CBC  Nexplanon in place   F/u pending labs

## 2019-08-13 ENCOUNTER — Other Ambulatory Visit: Payer: Self-pay | Admitting: Medical

## 2019-08-13 LAB — COMPREHENSIVE METABOLIC PANEL
ALT: 11 IU/L (ref 0–32)
AST: 10 IU/L (ref 0–40)
Albumin/Globulin Ratio: 1.1 — ABNORMAL LOW (ref 1.2–2.2)
Albumin: 3.8 g/dL (ref 3.8–4.8)
Alkaline Phosphatase: 81 IU/L (ref 48–121)
BUN/Creatinine Ratio: 10 (ref 9–23)
BUN: 8 mg/dL (ref 6–20)
Bilirubin Total: 0.3 mg/dL (ref 0.0–1.2)
CO2: 23 mmol/L (ref 20–29)
Calcium: 8.9 mg/dL (ref 8.7–10.2)
Chloride: 104 mmol/L (ref 96–106)
Creatinine, Ser: 0.77 mg/dL (ref 0.57–1.00)
GFR calc Af Amer: 114 mL/min/{1.73_m2} (ref >59)
GFR calc non Af Amer: 99 mL/min/{1.73_m2} (ref >59)
Globulin, Total: 3.4 g/dL (ref 1.5–4.5)
Glucose: 99 mg/dL (ref 65–99)
Potassium: 3.9 mmol/L (ref 3.5–5.2)
Sodium: 142 mmol/L (ref 134–144)
Total Protein: 7.2 g/dL (ref 6.0–8.5)

## 2019-08-13 LAB — CBC
Hematocrit: 36.2 % (ref 34.0–46.6)
Hemoglobin: 11.2 g/dL (ref 11.1–15.9)
MCH: 22.1 pg — ABNORMAL LOW (ref 26.6–33.0)
MCHC: 30.9 g/dL — ABNORMAL LOW (ref 31.5–35.7)
MCV: 71 fL — ABNORMAL LOW (ref 79–97)
Platelets: 312 10*3/uL (ref 150–450)
RBC: 5.07 x10E6/uL (ref 3.77–5.28)
RDW: 17.6 % — ABNORMAL HIGH (ref 11.7–15.4)
WBC: 8.9 10*3/uL (ref 3.4–10.8)

## 2019-08-13 LAB — HEMOGLOBIN A1C
Est. average glucose Bld gHb Est-mCnc: 137 mg/dL
Hgb A1c MFr Bld: 6.4 % — ABNORMAL HIGH (ref 4.8–5.6)

## 2019-08-13 LAB — IRON: Iron: 33 ug/dL (ref 27–159)

## 2019-08-13 LAB — TSH: TSH: 3.2 u[IU]/mL (ref 0.450–4.500)

## 2019-08-13 MED ORDER — OXYBUTYNIN CHLORIDE 5 MG PO TABS: 5.0000 mg | ORAL_TABLET | Freq: Two times a day (BID) | ORAL | 0 refills | Status: DC

## 2019-08-13 MED ORDER — TANDEM 162-115.2 MG PO CAPS: 1.0000 | ORAL_CAPSULE | Freq: Every day | ORAL | 0 refills | Status: DC

## 2019-08-13 MED ORDER — SAXENDA 18 MG/3ML ~~LOC~~ SOPN: 3.0000 mg | PEN_INJECTOR | Freq: Every day | SUBCUTANEOUS | 2 refills | Status: DC

## 2019-08-13 MED ORDER — BD PEN NEEDLE NANO U/F 32G X 4 MM MISC
1.0000 | Freq: Every day | 3 refills | Status: AC
Start: 2019-08-13 — End: ?

## 2019-08-13 MED ORDER — HYDROCHLOROTHIAZIDE 12.5 MG PO TABS
12.5000 mg | ORAL_TABLET | Freq: Every day | ORAL | 1 refills | Status: DC
Start: 2019-08-13 — End: 2019-11-20

## 2019-08-13 NOTE — Progress Notes (Signed)
oxybut

## 2019-09-17 ENCOUNTER — Other Ambulatory Visit: Payer: Self-pay | Admitting: Medical

## 2019-10-01 ENCOUNTER — Ambulatory Visit (HOSPITAL_BASED_OUTPATIENT_CLINIC_OR_DEPARTMENT_OTHER): Payer: PRIVATE HEALTH INSURANCE | Attending: Medical | Admitting: Internal Medicine

## 2019-11-20 ENCOUNTER — Ambulatory Visit: Payer: PRIVATE HEALTH INSURANCE | Admitting: Medical

## 2019-11-20 ENCOUNTER — Encounter: Payer: Self-pay | Admitting: Medical

## 2019-11-20 ENCOUNTER — Other Ambulatory Visit: Payer: Self-pay

## 2019-11-20 VITALS — BP 146/80 | HR 92 | Ht 62.0 in | Wt 381.2 lb

## 2019-11-20 DIAGNOSIS — Z6841 Body Mass Index (BMI) 40.0 and over, adult: Secondary | ICD-10-CM

## 2019-11-20 DIAGNOSIS — R350 Frequency of micturition: Secondary | ICD-10-CM

## 2019-11-20 DIAGNOSIS — R631 Polydipsia: Secondary | ICD-10-CM | POA: Insufficient documentation

## 2019-11-20 DIAGNOSIS — R7303 Prediabetes: Secondary | ICD-10-CM

## 2019-11-20 DIAGNOSIS — R300 Dysuria: Secondary | ICD-10-CM | POA: Diagnosis not present

## 2019-11-20 DIAGNOSIS — R8290 Unspecified abnormal findings in urine: Secondary | ICD-10-CM

## 2019-11-20 LAB — POCT URINALYSIS DIP (PROADVANTAGE DEVICE)
Glucose, UA: NEGATIVE mg/dL
Ketones, POC UA: NEGATIVE mg/dL
Leukocytes, UA: NEGATIVE
Nitrite, UA: NEGATIVE
Specific Gravity, Urine: 1.02
Urobilinogen, Ur: 0.2
pH, UA: 6 (ref 5.0–8.0)

## 2019-11-20 LAB — POCT GLYCOSYLATED HEMOGLOBIN (HGB A1C): Hemoglobin A1C: 6.1 % — AB (ref 4.0–5.6)

## 2019-11-20 MED ORDER — BD PEN NEEDLE NANO U/F 32G X 4 MM MISC
1.0000 | Freq: Every day | 2 refills | Status: AC
Start: 2019-11-20 — End: ?

## 2019-11-20 MED ORDER — SULFAMETHOXAZOLE-TRIMETHOPRIM 800-160 MG PO TABS: 1.0000 | ORAL_TABLET | Freq: Two times a day (BID) | ORAL | 0 refills | Status: DC

## 2019-11-20 MED ORDER — OZEMPIC (0.25 OR 0.5 MG/DOSE) 2 MG/1.5ML ~~LOC~~ SOPN: 0.5000 mg | PEN_INJECTOR | SUBCUTANEOUS | 2 refills | Status: DC

## 2019-11-20 MED ORDER — FLUCONAZOLE 150 MG PO TABS: 150.0000 mg | ORAL_TABLET | Freq: Once | ORAL | 0 refills | Status: AC

## 2019-11-20 MED ORDER — MIRABEGRON ER 25 MG PO TB24: 25.0000 mg | ORAL_TABLET | Freq: Every day | ORAL | 1 refills | Status: DC

## 2019-11-20 NOTE — Progress Notes (Signed)
Subjective:  Carlisia Rowland is a 39 y.o. female who presents for Chief Complaint  Patient presents with   Urinary Tract Infection      Urinary symptoms  She reports recurrent flank pain, lower abdominal pain, irritation and frequent urination. The current episode started today and is gradually worsening. Patient states symptoms are moderate in intensity, occurring today. She was has been recently treated for similar symptoms on 08/12/19. She is unable to keep food down without diarrhea.    Associated symptoms: Yes abdominal pain No back pain  No chills No constipation  Yes cramping Yes diarrhea (sometimes)  Yes discharge (clear) No fever  No hematuria No nausea  No vomiting    ---------------------------------------------------------------------------------------  She reports frequent thirst, polydipsia, and polyuria.  She does have some occasional moisture or discharge vaginally.  Urine odor.  After her last visit back in July she used oxybutynin briefly but then quit taking it.  It did seem to help.  She just does not like taking medication.  Last visit we discussed the fact that she is prediabetic.  She was going to use the [place] but it was going to cost over thousand dollars.  She did not follow-up.  She does note increased thirst.  She declines or refuses to go see bariatric clinic.  She has seen them in the past.  She does not want to take any type of pill for impaired glucose or prediabetes.  She does not feel she eats very much.  No other aggravating or relieving factors.    No other c/o.  Past Medical History:  Diagnosis Date   Anemia 2003   ongoing, has had heavy periods, iron deficiency   Dysmenorrhea    Nexplanon in place 01/2012   Obesity    Current Outpatient Medications on File Prior to Visit  Medication Sig Dispense Refill   Insulin Pen Needle (BD PEN NEEDLE NANO U/F) 32G X 4 MM MISC 1 each by Does not apply route at bedtime. (Patient not taking:  Reported on 11/20/2019) 100 each 3   No current facility-administered medications on file prior to visit.     The following portions of the patient's history were reviewed and updated as appropriate: allergies, current medications, past family history, past medical history, past social history, past surgical history and problem list.  ROS Otherwise as in subjective above  Objective: BP (!) 146/80    Pulse 92    Ht 5\' 2"  (1.575 m)    Wt (!) 381 lb 3.2 oz (172.9 kg)    SpO2 97%    BMI 69.72 kg/m   General appearance: alert, no distress, well developed, well nourished Abdomen: +bs, soft, non tender, non distended, no masses, no hepatomegaly, no splenomegaly Pulses: 2+ radial pulses, 2+ pedal pulses, normal cap refill Ext: no edema   Assessment: Encounter Diagnoses  Name Primary?   Urinary frequency Yes   Dysuria    Prediabetes    Polydipsia    BMI 60.0-69.9, adult (HCC)    Abnormal urine odor      Plan: We discussed her symptoms and possible differential of the urinary frequency and urine odor and polydipsia.  I am not convinced she has a urinary tract infection.  Urine culture sent.  I reviewed the urine culture back in July that did not show an infection.  Advise she hydrate well with water.  Over the weekend if she does end up getting worse urinary tract symptoms she can begin Bactrim antibiotic.    She  does have some symptoms suggestive of overactive bladder and did get improvement with oxybutynin back in July.  I gave her samples of Myrbetriq to try since it is once daily which she prefers.  Discussed risk benefits of medication.  Prediabetes, polydipsia-she does not want to use an oral medication.  Begin trial of Ozempic if covered by insurance.  Discussed the medication proper use.  Discussed risk and benefits of medication.  Morbid obesity-she refuses bariatric clinic consult although she has been there in the past.  Discussed the need to lose weight through  healthy lifestyle changes.  Begin Ozempic  Consider referral to urology for dysuria  Prescription for Diflucan in the event she starts the antibiotic since she is prone to yeast infection per her report   [name] was seen today for urinary tract infection.  Diagnoses and all orders for this visit:  Urinary frequency -     POCT Urinalysis DIP (Proadvantage Device) -     HgB A1c -     Urine Culture  Dysuria -     POCT Urinalysis DIP (Proadvantage Device) -     HgB A1c -     Urine Culture  Prediabetes  Polydipsia  BMI 60.0-69.9, adult (HCC)  Abnormal urine odor  Other orders -     Semaglutide,0.25 or 0.5MG /DOS, (OZEMPIC, 0.25 OR 0.5 MG/DOSE,) 2 MG/1.5ML SOPN; Inject 0.5 mg into the skin once a week. -     Insulin Pen Needle (BD PEN NEEDLE NANO U/F) 32G X 4 MM MISC; 1 each by Does not apply route at bedtime. -     sulfamethoxazole-trimethoprim (BACTRIM DS) 800-160 MG tablet; Take 1 tablet by mouth 2 (two) times daily. -     fluconazole (DIFLUCAN) 150 MG tablet; Take 1 tablet (150 mg total) by mouth once for 1 dose. weekly -     mirabegron ER (MYRBETRIQ) 25 MG TB24 tablet; Take 1 tablet (25 mg total) by mouth daily. Overactive bladder    Follow up: pending call back

## 2019-11-22 LAB — URINE CULTURE

## 2019-11-23 ENCOUNTER — Telehealth: Payer: Self-pay

## 2019-11-23 ENCOUNTER — Other Ambulatory Visit: Payer: Self-pay | Admitting: Medical

## 2019-11-23 MED ORDER — FLUCONAZOLE 150 MG PO TABS: 150.0000 mg | ORAL_TABLET | ORAL | 0 refills | Status: DC

## 2019-11-23 NOTE — Telephone Encounter (Signed)
P.A. OZEMPIC 

## 2019-11-26 NOTE — Telephone Encounter (Signed)
P.A.  Approved thru 11/22/20 called pharmacy and went thru for $24.99, called pt and informed.  She doesn't know how to use the pen, I advised to ask pharmacist when picks up if they can't show her to call back & can come by here and CMA will show her.

## 2019-12-28 ENCOUNTER — Other Ambulatory Visit: Payer: Self-pay | Admitting: Medical

## 2019-12-28 ENCOUNTER — Telehealth: Payer: Self-pay

## 2019-12-28 MED ORDER — SULFAMETHOXAZOLE-TRIMETHOPRIM 800-160 MG PO TABS: 1.0000 | ORAL_TABLET | Freq: Two times a day (BID) | ORAL | 0 refills | Status: DC

## 2019-12-28 NOTE — Telephone Encounter (Signed)
I sent another round of antibiotic.  She will need to follow-up if not resolved or improved within the next 10 days

## 2019-12-28 NOTE — Telephone Encounter (Signed)
Patient informed via my chart.

## 2019-12-28 NOTE — Telephone Encounter (Signed)
Patient called and stated she was taking Bactrim and then she got sick from unrelated possibly virus. She stopped taking it and just finished up over the weekend. She is still having lower back pain and frequent urination. Wants to know can she get another round of antibiotic or does she need to come in and be seen again. Please advise. She did start feeling better when she started taking it.

## 2020-01-14 ENCOUNTER — Emergency Department (HOSPITAL_COMMUNITY)
Admission: EM | Admit: 2020-01-14 | Discharge: 2020-01-15 | Disposition: A | Discharge status: Home / Self Care | Payer: PRIVATE HEALTH INSURANCE | Attending: Emergency Medicine | Admitting: Emergency Medicine

## 2020-01-14 ENCOUNTER — Other Ambulatory Visit: Payer: Self-pay

## 2020-01-14 ENCOUNTER — Emergency Department (HOSPITAL_COMMUNITY): Payer: PRIVATE HEALTH INSURANCE

## 2020-01-14 ENCOUNTER — Encounter (HOSPITAL_COMMUNITY): Payer: Self-pay

## 2020-01-14 DIAGNOSIS — U071 COVID-19: Secondary | ICD-10-CM | POA: Insufficient documentation

## 2020-01-14 DIAGNOSIS — Z794 Long term (current) use of insulin: Secondary | ICD-10-CM | POA: Insufficient documentation

## 2020-01-14 DIAGNOSIS — R059 Cough, unspecified: Secondary | ICD-10-CM | POA: Diagnosis present

## 2020-01-14 DIAGNOSIS — J069 Acute upper respiratory infection, unspecified: Secondary | ICD-10-CM | POA: Insufficient documentation

## 2020-01-14 DIAGNOSIS — E119 Type 2 diabetes mellitus without complications: Secondary | ICD-10-CM | POA: Insufficient documentation

## 2020-01-14 DIAGNOSIS — M546 Pain in thoracic spine: Secondary | ICD-10-CM | POA: Diagnosis not present

## 2020-01-14 NOTE — ED Provider Notes (Signed)
[facility]-EMERGENCY DEPT Provider Note   CSN: [id number] Arrival date & time: 01/14/20  2016     History Chief Complaint  Patient presents with  . Cough  . Generalized Body Aches    Hannahmarie Asberry is a 39 y.o. female.  The history is provided by the patient and medical records.  Cough  Ysenia Filice is a 39 y.o. female who presents to the Emergency Department complaining of cough and body aches. She presents the emergency department complaining of URI type symptoms for the last 2 1/2 weeks. She initially had runny nose, cough. She has been trying numerous over the counter medications with no significant improvement in symptoms. She reports over the last weekend especially over the last two days she is experienced progressive worsening with shortness of breath, cough productive of sputum and pain in her upper back on the right and left. No hemoptysis. No vomiting, diarrhea, leg swelling or pain. She is prediabetic, not currently on medications. She has been vaccinated for COVID-19 but her last dose of the vaccine was two days ago. No known sick contacts. She denies any chance of pregnancy. She does not take any hormones.    Past Medical History:  Diagnosis Date  . Anemia 2003   ongoing, has had heavy periods, iron deficiency  . Dysmenorrhea   . Nexplanon in place 01/2012  . Obesity     Patient Active Problem List   Diagnosis Date Noted  . Polydipsia 11/20/2019  . BMI 60.0-69.9, adult (HCC) 11/20/2019  . DOE (dyspnea on exertion) 08/12/2019  . Edema of both lower extremities 08/12/2019  . Impaired fasting blood sugar 08/12/2019  . Vitamin D deficiency 07/20/2019  . Folic acid deficiency 07/20/2019  . Witnessed apneic spells 07/20/2019  . Daytime somnolence 07/20/2019  . Snoring 07/20/2019  . Abnormal urine odor 07/20/2019  . Dysuria 07/20/2019  . Weight gain 07/20/2019  . Obesity with serious comorbidity 07/20/2019  . Iron deficiency 07/20/2019  .  Prediabetes 10/27/2018  . Upper back pain, chronic 07/16/2018  . Breast hypertrophy in female 07/16/2018  . Dysmenorrhea 08/15/2015  . Iron deficiency anemia 08/15/2015  . Nexplanon in place 08/15/2015  . Morbid obesity (HCC) 08/15/2015  . Routine general medical examination at a health care facility 08/15/2015  . Screening for condition 08/15/2015  . Urinary frequency 08/15/2015  . Family history of thyroid disease 08/15/2015  . Vaginal discharge 08/15/2015    Past Surgical History:  Procedure Laterality Date  . DILATION AND CURETTAGE OF UTERUS  2013  . INSERTION OF CONTRACEPTIVE CAPSULE  01/2012   nexplanon     OB History   No obstetric history on file.     Family History  Problem Relation Age of Onset  . Hypertension Mother   . Obesity Mother   . Diabetes Maternal Aunt   . Cancer Maternal Grandmother        stomach  . Diabetes Maternal Grandmother   . Hypertension Maternal Grandmother   . Cancer Other        stomach, cervical, bone, prostate, distant relatives  . Diabetes Maternal Aunt   . Stroke Neg Hx   . Heart disease Neg Hx     Social History   Tobacco Use  . Smoking status: Never Smoker  . Smokeless tobacco: Never Used  Vaping Use  . Vaping Use: Never used  Substance Use Topics  . Alcohol use: No    Alcohol/week: 0.0 standard drinks  . Drug use: No  Home Medications Prior to Admission medications   Medication Sig Start Date End Date Taking? Authorizing Provider  albuterol (VENTOLIN HFA) 108 (90 Base) MCG/ACT inhaler Inhale 2 puffs into the lungs every 4 (four) hours as needed for wheezing or shortness of breath. 01/15/20   Tilden Fossa, MD  benzonatate (TESSALON) 100 MG capsule Take 1 capsule (100 mg total) by mouth every 8 (eight) hours. 01/15/20   Tilden Fossa, MD  fluconazole (DIFLUCAN) 150 MG tablet Take 1 tablet (150 mg total) by mouth once a week. 11/23/19   Tysinger, [name], PA-C  Insulin Pen Needle (BD PEN NEEDLE NANO U/F) 32G X 4  MM MISC 1 each by Does not apply route at bedtime. Patient not taking: Reported on 11/20/2019 08/13/19   Tysinger, [name], PA-C  Insulin Pen Needle (BD PEN NEEDLE NANO U/F) 32G X 4 MM MISC 1 each by Does not apply route at bedtime. 11/20/19   Tysinger, [name], PA-C  mirabegron ER (MYRBETRIQ) 25 MG TB24 tablet Take 1 tablet (25 mg total) by mouth daily. Overactive bladder 11/20/19   Tysinger, [name], PA-C  Semaglutide,0.25 or 0.5MG /DOS, (OZEMPIC, 0.25 OR 0.5 MG/DOSE,) 2 MG/1.5ML SOPN Inject 0.5 mg into the skin once a week. 11/20/19   Tysinger, [name], PA-C  sulfamethoxazole-trimethoprim (BACTRIM DS) 800-160 MG tablet Take 1 tablet by mouth 2 (two) times daily. 12/28/19   Tysinger, [name], PA-C    Allergies    Patient has no known allergies.  Review of Systems   Review of Systems  Respiratory: Positive for cough.   All other systems reviewed and are negative.   Physical Exam Updated Vital Signs BP 121/61   Pulse 100   Temp 98.9 F (37.2 C) (Oral)   Resp 20   Ht 5\' 2"  (1.575 m)   Wt (!) 163.3 kg   SpO2 100%   BMI 65.84 kg/m   Physical Exam Vitals and nursing note reviewed.  Constitutional:      Appearance: She is well-developed and well-nourished.  HENT:     Head: Normocephalic and atraumatic.  Cardiovascular:     Rate and Rhythm: Normal rate and regular rhythm.     Heart sounds: No murmur heard.   Pulmonary:     Effort: Pulmonary effort is normal. No respiratory distress.     Breath sounds: Normal breath sounds.  Abdominal:     Palpations: Abdomen is soft.     Tenderness: There is no abdominal tenderness. There is no guarding or rebound.  Musculoskeletal:        General: No swelling, tenderness or edema.  Skin:    General: Skin is warm and dry.  Neurological:     Mental Status: She is alert and oriented to person, place, and time.  Psychiatric:        Mood and Affect: Mood and affect normal.        Behavior: Behavior normal.     ED Results / Procedures /  Treatments   Labs (all labs ordered are listed, but only abnormal results are displayed) Labs Reviewed  RESP PANEL BY RT-PCR (FLU A&B, COVID) ARPGX2 - Abnormal; Notable for the following components:      Result Value   SARS Coronavirus 2 by RT PCR POSITIVE (*)    All other components within normal limits    EKG None  Radiology DG Chest 2 View  Result Date: 01/14/2020 CLINICAL DATA:  Generalized body aches, chest pain, cough, shortness of breath X 3 weeks. EXAM: CHEST -  2 VIEW COMPARISON:  Chest x-ray 05/08/2017 FINDINGS: The heart size and mediastinal contours are unchanged. Redemonstration of vascular hilar prominence. Low lung volumes. No focal consolidation. No pulmonary edema. No pleural effusion. No pneumothorax. No acute osseous abnormality. IMPRESSION: 1. Low lung volumes with bilateral lower lobe compressive changes. 2. Mild pulmonary congestion. 3. No definite focal consolidation. Electronically Signed   By: Tish Frederickson M.D.   On: 01/14/2020 23:50    Procedures Procedures (including critical care time)  Medications Ordered in ED Medications - No data to display  ED Course  I have reviewed the triage vital signs and the nursing notes.  Pertinent labs & imaging results that were available during my care of the patient were reviewed by me and considered in my medical decision making (see chart for details).    MDM Rules/Calculators/A&P                         patient here for evaluation of three weeks of cough, congestion, back pain. She is non-toxic appearing on examination with no respiratory distress. Presentation is not consistent with CHF, PE, pneumonia. Discussed with patient concern for viral respiratory infection. Symptoms did start several weeks ago. She was discharged home with COVID-19 test pending. Discussed home care for viral URI, outpatient follow-up and return precautions.   COVID-19 test resulted back positive after patient's hospital discharge.  Attempted to contact her over the phone regarding test results, no answer on her phone. Given duration of symptoms that she is not a candidate for monoclonal antibody infusion.  Nakkia Mackiewicz was evaluated in Emergency Department on 01/15/2020 for the symptoms described in the history of present illness. She was evaluated in the context of the global COVID-19 pandemic, which necessitated consideration that the patient might be at risk for infection with the SARS-CoV-2 virus that causes COVID-19. Institutional protocols and algorithms that pertain to the evaluation of patients at risk for COVID-19 are in a state of rapid change based on information released by regulatory bodies including the CDC and federal and state organizations. These policies and algorithms were followed during the patient's care in the ED.  Final Clinical Impression(s) / ED Diagnoses Final diagnoses:  Viral upper respiratory tract infection    Rx / DC Orders ED Discharge Orders         Ordered    benzonatate (TESSALON) 100 MG capsule  Every 8 hours        01/15/20 0015    albuterol (VENTOLIN HFA) 108 (90 Base) MCG/ACT inhaler  Every 4 hours PRN        01/15/20 0015           Tilden Fossa, MD 01/15/20 [phone]

## 2020-01-14 NOTE — ED Triage Notes (Signed)
Pt presents with c/o generalized body aches and cough. Pt reports she just received her second dose of the Covid vaccine on Tuesday.

## 2020-01-15 LAB — RESP PANEL BY RT-PCR (FLU A&B, COVID) ARPGX2
Influenza A by PCR: NEGATIVE
Influenza B by PCR: NEGATIVE
SARS Coronavirus 2 by RT PCR: POSITIVE — AB

## 2020-01-15 MED ORDER — ALBUTEROL SULFATE HFA 108 (90 BASE) MCG/ACT IN AERS: 2.0000 | INHALATION_SPRAY | RESPIRATORY_TRACT | 0 refills | Status: AC | PRN

## 2020-01-15 MED ORDER — BENZONATATE 100 MG PO CAPS: 100.0000 mg | ORAL_CAPSULE | Freq: Three times a day (TID) | ORAL | 0 refills | Status: DC

## 2020-01-19 ENCOUNTER — Emergency Department (HOSPITAL_COMMUNITY): Payer: PRIVATE HEALTH INSURANCE

## 2020-01-19 ENCOUNTER — Other Ambulatory Visit: Payer: Self-pay

## 2020-01-19 ENCOUNTER — Encounter (HOSPITAL_COMMUNITY): Payer: Self-pay

## 2020-01-19 ENCOUNTER — Inpatient Hospital Stay (HOSPITAL_COMMUNITY)
Admission: EM | Admit: 2020-01-19 | Discharge: 2020-01-22 | DRG: 177 | Disposition: A | Discharge status: Home / Self Care | Payer: PRIVATE HEALTH INSURANCE | Attending: Family Medicine | Admitting: Family Medicine

## 2020-01-19 DIAGNOSIS — Z809 Family history of malignant neoplasm, unspecified: Secondary | ICD-10-CM

## 2020-01-19 DIAGNOSIS — U071 COVID-19: Secondary | ICD-10-CM | POA: Diagnosis present

## 2020-01-19 DIAGNOSIS — E876 Hypokalemia: Secondary | ICD-10-CM | POA: Diagnosis present

## 2020-01-19 DIAGNOSIS — E662 Morbid (severe) obesity with alveolar hypoventilation: Secondary | ICD-10-CM | POA: Diagnosis present

## 2020-01-19 DIAGNOSIS — Z6841 Body Mass Index (BMI) 40.0 and over, adult: Secondary | ICD-10-CM | POA: Diagnosis not present

## 2020-01-19 DIAGNOSIS — Z8249 Family history of ischemic heart disease and other diseases of the circulatory system: Secondary | ICD-10-CM

## 2020-01-19 DIAGNOSIS — Z79899 Other long term (current) drug therapy: Secondary | ICD-10-CM | POA: Diagnosis not present

## 2020-01-19 DIAGNOSIS — J9601 Acute respiratory failure with hypoxia: Secondary | ICD-10-CM | POA: Diagnosis present

## 2020-01-19 DIAGNOSIS — J1282 Pneumonia due to coronavirus disease 2019: Secondary | ICD-10-CM | POA: Diagnosis present

## 2020-01-19 DIAGNOSIS — T380X5A Adverse effect of glucocorticoids and synthetic analogues, initial encounter: Secondary | ICD-10-CM | POA: Diagnosis present

## 2020-01-19 DIAGNOSIS — R0602 Shortness of breath: Secondary | ICD-10-CM | POA: Diagnosis present

## 2020-01-19 DIAGNOSIS — D509 Iron deficiency anemia, unspecified: Secondary | ICD-10-CM | POA: Diagnosis present

## 2020-01-19 DIAGNOSIS — R7301 Impaired fasting glucose: Secondary | ICD-10-CM

## 2020-01-19 DIAGNOSIS — Z794 Long term (current) use of insulin: Secondary | ICD-10-CM

## 2020-01-19 DIAGNOSIS — R7303 Prediabetes: Secondary | ICD-10-CM | POA: Diagnosis not present

## 2020-01-19 DIAGNOSIS — Z833 Family history of diabetes mellitus: Secondary | ICD-10-CM

## 2020-01-19 DIAGNOSIS — E8881 Metabolic syndrome: Secondary | ICD-10-CM | POA: Diagnosis present

## 2020-01-19 LAB — COMPREHENSIVE METABOLIC PANEL
ALT: 19 U/L (ref 0–44)
AST: 14 U/L — ABNORMAL LOW (ref 15–41)
Albumin: 3.2 g/dL — ABNORMAL LOW (ref 3.5–5.0)
Alkaline Phosphatase: 59 U/L (ref 38–126)
Anion gap: 11 (ref 5–15)
BUN: 8 mg/dL (ref 6–20)
CO2: 26 mmol/L (ref 22–32)
Calcium: 8.1 mg/dL — ABNORMAL LOW (ref 8.9–10.3)
Chloride: 101 mmol/L (ref 98–111)
Creatinine, Ser: 0.82 mg/dL (ref 0.44–1.00)
GFR, Estimated: >60 mL/min (ref >60)
Glucose, Bld: 105 mg/dL — ABNORMAL HIGH (ref 70–99)
Potassium: 3.3 mmol/L — ABNORMAL LOW (ref 3.5–5.1)
Sodium: 138 mmol/L (ref 135–145)
Total Bilirubin: 1 mg/dL (ref 0.3–1.2)
Total Protein: 7 g/dL (ref 6.5–8.1)

## 2020-01-19 LAB — CBC WITH DIFFERENTIAL/PLATELET
Abs Immature Granulocytes: 0.06 10*3/uL (ref 0.00–0.07)
Basophils Absolute: 0 10*3/uL (ref 0.0–0.1)
Basophils Relative: 0 %
Eosinophils Absolute: 0.1 10*3/uL (ref 0.0–0.5)
Eosinophils Relative: 1 %
HCT: 32.9 % — ABNORMAL LOW (ref 36.0–46.0)
Hemoglobin: 9.8 g/dL — ABNORMAL LOW (ref 12.0–15.0)
Immature Granulocytes: 1 %
Lymphocytes Relative: 23 %
Lymphs Abs: 2.2 10*3/uL (ref 0.7–4.0)
MCH: 22.7 pg — ABNORMAL LOW (ref 26.0–34.0)
MCHC: 29.8 g/dL — ABNORMAL LOW (ref 30.0–36.0)
MCV: 76.2 fL — ABNORMAL LOW (ref 80.0–100.0)
Monocytes Absolute: 0.4 10*3/uL (ref 0.1–1.0)
Monocytes Relative: 4 %
Neutro Abs: 6.6 10*3/uL (ref 1.7–7.7)
Neutrophils Relative %: 71 %
Platelets: 231 10*3/uL (ref 150–400)
RBC: 4.32 MIL/uL (ref 3.87–5.11)
RDW: 17.6 % — ABNORMAL HIGH (ref 11.5–15.5)
WBC: 9.3 10*3/uL (ref 4.0–10.5)
nRBC: 0 % (ref 0.0–0.2)

## 2020-01-19 LAB — GLUCOSE, CAPILLARY
Glucose-Capillary: 122 mg/dL — ABNORMAL HIGH (ref 70–99)
Glucose-Capillary: 140 mg/dL — ABNORMAL HIGH (ref 70–99)
Glucose-Capillary: 174 mg/dL — ABNORMAL HIGH (ref 70–99)
Glucose-Capillary: 145 mg/dL — ABNORMAL HIGH (ref 70–99)

## 2020-01-19 LAB — I-STAT BETA HCG BLOOD, ED (MC, WL, AP ONLY): I-stat hCG, quantitative: <5 m[IU]/mL (ref <5)

## 2020-01-19 LAB — BLOOD GAS, VENOUS
Acid-Base Excess: 3.8 mmol/L — ABNORMAL HIGH (ref 0.0–2.0)
Bicarbonate: 29.2 mmol/L — ABNORMAL HIGH (ref 20.0–28.0)
FIO2: 21
O2 Saturation: 65.7 %
Patient temperature: 98.6
pCO2, Ven: 50.9 mmHg (ref 44.0–60.0)
pH, Ven: 7.378 (ref 7.250–7.430)
pO2, Ven: 38.6 mmHg (ref 32.0–45.0)

## 2020-01-19 LAB — TRIGLYCERIDES: Triglycerides: 46 mg/dL (ref <150)

## 2020-01-19 LAB — C-REACTIVE PROTEIN: CRP: 12.7 mg/dL — ABNORMAL HIGH (ref <1.0)

## 2020-01-19 LAB — LACTIC ACID, PLASMA
Lactic Acid, Venous: 0.7 mmol/L (ref 0.5–1.9)
Lactic Acid, Venous: 1 mmol/L (ref 0.5–1.9)

## 2020-01-19 LAB — HIV ANTIBODY (ROUTINE TESTING W REFLEX): HIV Screen 4th Generation wRfx: NONREACTIVE

## 2020-01-19 LAB — HEMOGLOBIN A1C
Hgb A1c MFr Bld: 5.9 % — ABNORMAL HIGH (ref 4.8–5.6)
Mean Plasma Glucose: 122.63 mg/dL

## 2020-01-19 LAB — FIBRINOGEN: Fibrinogen: 561 mg/dL — ABNORMAL HIGH (ref 210–475)

## 2020-01-19 LAB — PROCALCITONIN: Procalcitonin: <0.1 ng/mL

## 2020-01-19 LAB — BRAIN NATRIURETIC PEPTIDE: B Natriuretic Peptide: 23.1 pg/mL (ref 0.0–100.0)

## 2020-01-19 LAB — LACTATE DEHYDROGENASE: LDH: 149 U/L (ref 98–192)

## 2020-01-19 LAB — FERRITIN: Ferritin: 91 ng/mL (ref 11–307)

## 2020-01-19 LAB — D-DIMER, QUANTITATIVE: D-Dimer, Quant: 0.5 ug/mL-FEU (ref 0.00–0.50)

## 2020-01-19 MED ORDER — ASCORBIC ACID 500 MG PO TABS
500.0000 mg | ORAL_TABLET | Freq: Every day | ORAL | Status: DC
  Administered 2020-01-19 – 2020-01-22 (×4): 500 mg via ORAL
  Filled 2020-01-19 (×4): qty 1

## 2020-01-19 MED ORDER — DEXAMETHASONE SODIUM PHOSPHATE 10 MG/ML IJ SOLN: 6.0000 mg | INTRAMUSCULAR | Status: DC

## 2020-01-19 MED ORDER — LACTATED RINGERS IV SOLN: INTRAVENOUS | Status: DC

## 2020-01-19 MED ORDER — INSULIN ASPART 100 UNIT/ML ~~LOC~~ SOLN
0.0000 [IU] | Freq: Every day | SUBCUTANEOUS | Status: DC
  Filled 2020-01-19: qty 0.05

## 2020-01-19 MED ORDER — GUAIFENESIN-DM 100-10 MG/5ML PO SYRP
10.0000 mL | ORAL_SOLUTION | ORAL | Status: DC | PRN
  Administered 2020-01-20 – 2020-01-22 (×4): 10 mL via ORAL
  Filled 2020-01-19 (×4): qty 10

## 2020-01-19 MED ORDER — ALBUTEROL SULFATE HFA 108 (90 BASE) MCG/ACT IN AERS: 2.0000 | INHALATION_SPRAY | RESPIRATORY_TRACT | Status: DC | PRN

## 2020-01-19 MED ORDER — ACETAMINOPHEN 325 MG PO TABS
650.0000 mg | ORAL_TABLET | Freq: Once | ORAL | Status: AC | PRN
  Administered 2020-01-19: 650 mg via ORAL
  Filled 2020-01-19: qty 2

## 2020-01-19 MED ORDER — INSULIN ASPART 100 UNIT/ML ~~LOC~~ SOLN
0.0000 [IU] | Freq: Three times a day (TID) | SUBCUTANEOUS | Status: DC
  Administered 2020-01-19: 12:00:00 3 [IU] via SUBCUTANEOUS
  Administered 2020-01-19: 17:00:00 4 [IU] via SUBCUTANEOUS
  Administered 2020-01-19: 10:00:00 3 [IU] via SUBCUTANEOUS
  Administered 2020-01-20 – 2020-01-21 (×4): 4 [IU] via SUBCUTANEOUS
  Administered 2020-01-21 – 2020-01-22 (×4): 3 [IU] via SUBCUTANEOUS
  Filled 2020-01-19: qty 0.2

## 2020-01-19 MED ORDER — POTASSIUM CHLORIDE CRYS ER 20 MEQ PO TBCR
40.0000 meq | EXTENDED_RELEASE_TABLET | Freq: Once | ORAL | Status: AC
  Administered 2020-01-19: 07:00:00 40 meq via ORAL
  Filled 2020-01-19: qty 2

## 2020-01-19 MED ORDER — LACTATED RINGERS IV BOLUS
1000.0000 mL | Freq: Once | INTRAVENOUS | Status: AC
  Administered 2020-01-19: 07:00:00 1000 mL via INTRAVENOUS

## 2020-01-19 MED ORDER — METHYLPREDNISOLONE SODIUM SUCC 125 MG IJ SOLR
125.0000 mg | Freq: Once | INTRAMUSCULAR | Status: AC
  Administered 2020-01-19: 07:00:00 125 mg via INTRAVENOUS
  Filled 2020-01-19: qty 2

## 2020-01-19 MED ORDER — ALBUTEROL SULFATE HFA 108 (90 BASE) MCG/ACT IN AERS
4.0000 | INHALATION_SPRAY | Freq: Once | RESPIRATORY_TRACT | Status: AC
  Administered 2020-01-19: 07:00:00 4 via RESPIRATORY_TRACT
  Filled 2020-01-19: qty 6.7

## 2020-01-19 MED ORDER — SODIUM CHLORIDE 0.9 % IV SOLN
100.0000 mg | Freq: Every day | INTRAVENOUS | Status: DC
  Administered 2020-01-20 – 2020-01-22 (×3): 100 mg via INTRAVENOUS
  Filled 2020-01-19 (×3): qty 20

## 2020-01-19 MED ORDER — INSULIN ASPART 100 UNIT/ML ~~LOC~~ SOLN
0.0000 [IU] | Freq: Three times a day (TID) | SUBCUTANEOUS | Status: DC
  Filled 2020-01-19: qty 0.15

## 2020-01-19 MED ORDER — ONDANSETRON HCL 4 MG/2ML IJ SOLN: 4.0000 mg | Freq: Four times a day (QID) | INTRAMUSCULAR | Status: DC | PRN

## 2020-01-19 MED ORDER — LINAGLIPTIN 5 MG PO TABS
5.0000 mg | ORAL_TABLET | Freq: Every day | ORAL | Status: DC
  Administered 2020-01-19 – 2020-01-22 (×4): 5 mg via ORAL
  Filled 2020-01-19 (×4): qty 1

## 2020-01-19 MED ORDER — ZINC SULFATE 220 (50 ZN) MG PO CAPS
220.0000 mg | ORAL_CAPSULE | Freq: Every day | ORAL | Status: DC
  Administered 2020-01-19 – 2020-01-22 (×4): 220 mg via ORAL
  Filled 2020-01-19 (×4): qty 1

## 2020-01-19 MED ORDER — MIRABEGRON ER 25 MG PO TB24
25.0000 mg | ORAL_TABLET | Freq: Every day | ORAL | Status: DC
  Administered 2020-01-19 – 2020-01-22 (×4): 25 mg via ORAL
  Filled 2020-01-19 (×4): qty 1

## 2020-01-19 MED ORDER — POLYETHYLENE GLYCOL 3350 17 G PO PACK: 17.0000 g | PACK | Freq: Every day | ORAL | Status: DC | PRN

## 2020-01-19 MED ORDER — METHYLPREDNISOLONE SODIUM SUCC 125 MG IJ SOLR: 60.0000 mg | Freq: Three times a day (TID) | INTRAMUSCULAR | Status: DC

## 2020-01-19 MED ORDER — METHYLPREDNISOLONE SODIUM SUCC 125 MG IJ SOLR
60.0000 mg | Freq: Three times a day (TID) | INTRAMUSCULAR | Status: DC
  Administered 2020-01-19 – 2020-01-22 (×8): 60 mg via INTRAVENOUS
  Filled 2020-01-19 (×8): qty 2

## 2020-01-19 MED ORDER — SODIUM CHLORIDE 0.9 % IV SOLN
200.0000 mg | Freq: Once | INTRAVENOUS | Status: AC
  Administered 2020-01-19: 10:00:00 200 mg via INTRAVENOUS
  Filled 2020-01-19: qty 200

## 2020-01-19 MED ORDER — ACETAMINOPHEN 325 MG PO TABS
650.0000 mg | ORAL_TABLET | Freq: Four times a day (QID) | ORAL | Status: DC | PRN
  Administered 2020-01-19 – 2020-01-21 (×5): 650 mg via ORAL
  Filled 2020-01-19 (×5): qty 2

## 2020-01-19 MED ORDER — ENOXAPARIN SODIUM 80 MG/0.8ML ~~LOC~~ SOLN
80.0000 mg | SUBCUTANEOUS | Status: DC
  Administered 2020-01-19 – 2020-01-22 (×4): 80 mg via SUBCUTANEOUS
  Filled 2020-01-19 (×4): qty 0.8

## 2020-01-19 MED ORDER — ONDANSETRON HCL 4 MG PO TABS: 4.0000 mg | ORAL_TABLET | Freq: Four times a day (QID) | ORAL | Status: DC | PRN

## 2020-01-19 NOTE — H&P (Addendum)
History and Physical    Markiyah Gahm DXI:[id number] DOB: [birth year] DOA: 01/19/2020  PCP: Jac Canavan, PA-C  Patient coming from: Home   Chief Complaint:  Chief Complaint  Patient presents with  . Shortness of Breath     HPI:    39 year old female with past medical history of morbid obesity, prediabetes who presents to [facility] emergency department with complaints of shortness of breath.  Patient explains that for the past 2 weeks she has been experiencing shortness of breath and cough.  Patient explains that her shortness of breath initially was mild but as the days progressed became progressively more and more severe.  Shortness breath is worse with exertion and improved with rest.  Alongside patient shortness of breath patient has continued to exhibit a severe frequent cough.  Patient describes that her cough is typically nonproductive but occasionally is productive with white to yellow sputum.  As the patient symptoms persisted patient also experienced generalized weakness, muscle aches and poor appetite.  Patient also complains of intermittent fevers at home.  Patient denies sick contacts, recent travel or contacts with confirmed COVID-19 infection.  Patient eventually presented on 12/16 with the above-mentioned symptoms to our emergency department and was tested for COVID-19 and was found to be positive.  Patient was initially discharged home for conservative management.  Patient symptoms continued to progressively worsen in the days that followed and the patient has again presented to [facility] emergency department this morning on 12/21 for evaluation.  Upon evaluation in the emergency department, patient has been found to be intermittently hypoxic on room air with oxygen saturations noted to be as low as 77%.  Patient was placed on supplemental oxygen with nasal cannula.  Chest x-ray performed reveals bilateral multifocal infiltrates consistent with  Covid infection.  The hospitalist group is now been called to assist the patient for mission to the hospital.  Review of Systems:   Review of Systems  Constitutional: Positive for fever and malaise/fatigue.  Respiratory: Positive for cough, sputum production and shortness of breath.   Neurological: Positive for weakness.  All other systems reviewed and are negative.   Past Medical History:  Diagnosis Date  . Anemia 2003   ongoing, has had heavy periods, iron deficiency  . Dysmenorrhea   . Nexplanon in place 01/2012  . Obesity     Past Surgical History:  Procedure Laterality Date  . DILATION AND CURETTAGE OF UTERUS  2013  . INSERTION OF CONTRACEPTIVE CAPSULE  01/2012   nexplanon     reports that she has never smoked. She has never used smokeless tobacco. She reports that she does not drink alcohol and does not use drugs.  No Known Allergies  Family History  Problem Relation Age of Onset  . Hypertension Mother   . Obesity Mother   . Diabetes Maternal Aunt   . Cancer Maternal Grandmother        stomach  . Diabetes Maternal Grandmother   . Hypertension Maternal Grandmother   . Cancer Other        stomach, cervical, bone, prostate, distant relatives  . Diabetes Maternal Aunt   . Stroke Neg Hx   . Heart disease Neg Hx      Prior to Admission medications   Medication Sig Start Date End Date Taking? Authorizing Provider  mirabegron ER (MYRBETRIQ) 25 MG TB24 tablet Take 1 tablet (25 mg total) by mouth daily. Overactive bladder 11/20/19  Yes Tysinger, [name], PA-C  Semaglutide,0.25 or 0.5MG /DOS, (  OZEMPIC, 0.25 OR 0.5 MG/DOSE,) 2 MG/1.5ML SOPN Inject 0.5 mg into the skin once a week. 11/20/19  Yes Tysinger, [name], PA-C  albuterol (VENTOLIN HFA) 108 (90 Base) MCG/ACT inhaler Inhale 2 puffs into the lungs every 4 (four) hours as needed for wheezing or shortness of breath. 01/15/20   Tilden Fossa, MD  benzonatate (TESSALON) 100 MG capsule Take 1 capsule (100 mg total) by  mouth every 8 (eight) hours. 01/15/20   Tilden Fossa, MD  fluconazole (DIFLUCAN) 150 MG tablet Take 1 tablet (150 mg total) by mouth once a week. Patient not taking: Reported on 01/19/2020 11/23/19   Tysinger, [name], PA-C  Insulin Pen Needle (BD PEN NEEDLE NANO U/F) 32G X 4 MM MISC 1 each by Does not apply route at bedtime. Patient not taking: No sig reported 08/13/19   Tysinger, [name], PA-C  Insulin Pen Needle (BD PEN NEEDLE NANO U/F) 32G X 4 MM MISC 1 each by Does not apply route at bedtime. 11/20/19   Tysinger, [name], PA-C  sulfamethoxazole-trimethoprim (BACTRIM DS) 800-160 MG tablet Take 1 tablet by mouth 2 (two) times daily. Patient not taking: Reported on 01/19/2020 12/28/19   Jac Canavan, PA-C    Physical Exam: Vitals:   01/19/20 0430 01/19/20 0500 01/19/20 0515 01/19/20 0527  BP: 120/65 123/80 139/86   Pulse: 96 (!) 101 (!) 121 (!) 101  Resp: (!) 27 (!) 26 (!) 25 (!) 30  Temp:      TempSrc:      SpO2: 98% 94% (!) 78% 97%    Constitutional: Acute alert and oriented x3, patient is in respiratory distress.  Patient is obese. Skin: no rashes, no lesions, good skin turgor noted. Eyes: Pupils are equally reactive to light.  No evidence of scleral icterus or conjunctival pallor.  ENMT: Somewhat dry mucous membranes noted.  Posterior pharynx clear of any exudate or lesions.   Neck: normal, supple, no masses, no thyromegaly.  No evidence of jugular venous distension.   Respiratory: Diminished breath sounds at the bases with faint bibasilar rales.  No evidence of wheezing.  Patient is tachypneic without evidence of  accessory muscle use.  Cardiovascular: Tachycardic rate with regular rhythm no murmurs / rubs / gallops. No extremity edema. 2+ pedal pulses. No carotid bruits.  Chest:   Nontender without crepitus or deformity.   Back:   Nontender without crepitus or deformity. Abdomen: Abdomen is protuberant but soft and nontender.  No evidence of intra-abdominal masses.   Positive bowel sounds noted in all quadrants.   Musculoskeletal: No joint deformity upper and lower extremities. Good ROM, no contractures. Normal muscle tone.  Neurologic: CN 2-12 grossly intact. Sensation intact.  Patient moving all 4 extremities spontaneously.  Patient is following all commands.  Patient is responsive to verbal stimuli.   Psychiatric: Patient exhibits normal mood with appropriate affect.  Patient seems to possess insight as to their current situation.     Labs on Admission: I have personally reviewed following labs and imaging studies -   CBC: Recent Labs  Lab 01/19/20 0427  WBC 9.3  NEUTROABS 6.6  HGB 9.8*  HCT 32.9*  MCV 76.2*  PLT 231   Basic Metabolic Panel: Recent Labs  Lab 01/19/20 0427  NA 138  K 3.3*  CL 101  CO2 26  GLUCOSE 105*  BUN 8  CREATININE 0.82  CALCIUM 8.1*   GFR: Estimated Creatinine Clearance: 141.5 mL/min (by C-G formula based on SCr of 0.82 mg/dL). Liver Function Tests:  Recent Labs  Lab 01/19/20 0427  AST 14*  ALT 19  ALKPHOS 59  BILITOT 1.0  PROT 7.0  ALBUMIN 3.2*   No results for input(s): LIPASE, AMYLASE in the last 168 hours. No results for input(s): AMMONIA in the last 168 hours. Coagulation Profile: No results for input(s): INR, PROTIME in the last 168 hours. Cardiac Enzymes: No results for input(s): CKTOTAL, CKMB, CKMBINDEX, TROPONINI in the last 168 hours. BNP (last 3 results) No results for input(s): PROBNP in the last 8760 hours. HbA1C: No results for input(s): HGBA1C in the last 72 hours. CBG: No results for input(s): GLUCAP in the last 168 hours. Lipid Profile: Recent Labs    01/19/20 0427  TRIG 46   Thyroid Function Tests: No results for input(s): TSH, T4TOTAL, FREET4, T3FREE, THYROIDAB in the last 72 hours. Anemia Panel: Recent Labs    01/19/20 0427  FERRITIN 91   Urine analysis:    Component Value Date/Time   LABSPEC 1.020 11/20/2019 1537   PHURINE 7.0 08/27/2017 1800   GLUCOSEU  NEGATIVE 08/27/2017 1800   HGBUR SMALL (A) 08/27/2017 1800   BILIRUBINUR small (A) 11/20/2019 1537   BILIRUBINUR n 08/15/2015 1426   KETONESUR negative 11/20/2019 1537   KETONESUR NEGATIVE 08/27/2017 1800   PROTEINUR trace (A) 11/20/2019 1537   PROTEINUR NEGATIVE 08/27/2017 1800   UROBILINOGEN 0.2 08/27/2017 1800   NITRITE Negative 11/20/2019 1537   NITRITE NEGATIVE 08/27/2017 1800   LEUKOCYTESUR Negative 11/20/2019 1537    Radiological Exams on Admission - Personally Reviewed: DG Chest 2 View  Result Date: 01/19/2020 CLINICAL DATA:  COVID positive with weakness and shortness of breath. EXAM: CHEST - 2 VIEW COMPARISON:  January 14, 2020 FINDINGS: Mild to moderate severity ill-defined multifocal infiltrates are seen within the mid and lower lung fields, bilaterally. There is no evidence of a pleural effusion or pneumothorax. The heart size and mediastinal contours are within normal limits. The visualized skeletal structures are unremarkable. IMPRESSION: Mild to moderate severity bilateral multifocal infiltrates. Electronically Signed   By: Aram Candelahaddeus  Houston M.D.   On: 01/19/2020 01:14    EKG: Personally reviewed.  Rhythm is sinus tachycardia with heart rate of 100 bpm.  No dynamic ST segment changes appreciated.  Assessment/Plan Principal Problem:   COVID-19 virus infection   Patient presenting with 2-1/2-week history of generalized malaise, fatigue, shortness of breath, cough and fever  COVID-19 PCR testing found to be positive in our emergency department on 12/16  Upon evaluation in the emergency department patient is additionally been found to be hypoxic at rest with oxygen saturations in the 70s requiring initiation of supplemental oxygen via nasal cannula  Chest x-ray reveals bilateral multifocal pneumonia consistent with COVID-19  However, patient's Covid biochemical markers are remarkably all completely normal  I suspect some degree of obesity hypoventilation syndrome as  well in this patient it may be contributing to patient's hypoxia  I believe patient is at high risk of decompensation and therefore I am initiating intravenous remdesivir and dexamethasone  Providing patient with as needed antitussives  Providing patient with as needed bronchodilator therapy  Providing patient with zinc and vitamin C supplementation  Placing patient in COVID-19 unit  Hydrating patient with intravenous isotonic fluids  Active Problems:   Prediabetes   Longstanding known history of prediabetes, on Ozempic in the outpatient setting  Patient will likely become hyperglycemic with initiation of systemic steroids  Initiating Accu-Cheks before every meal and nightly with sliding scale insulin  Hemoglobin A1c pending  Class 3 severe obesity due to excess calories with serious comorbidity and body mass index (BMI) of 60.0 to 69.9 in adult [facility])    Once patient is clinically improved will counsel patient on caloric restriction and regular physical activity.  Patient would benefit from bariatric clinic outpatient referral.  Hypokalemia   Patient exhibiting mild hypokalemia likely secondary to poor oral intake  Providing patient with oral potassium chloride for supplementation  Monitoring potassium levels with serial chemistries  Code Status:  Full code Family Communication: Deferred  Status is: Observation  The patient remains OBS appropriate and will d/c before 2 midnights.  Dispo: The patient is from: Home              Anticipated d/c is to: Home              Anticipated d/c date is: 2 days              Patient currently is not medically stable to d/c.        Marinda Elk MD [facility] Pager [phone]  If 7PM-7AM, please contact night-coverage [email or web address] Use universal [place] password for that web site. If you do not have the password, please call the hospital operator.  01/19/2020, 6:22 AM

## 2020-01-19 NOTE — ED Notes (Signed)
Ambulated pt around room, pt started at 95% and dropped to 87% on room air.

## 2020-01-19 NOTE — ED Notes (Signed)
Admitting provider at bedside.

## 2020-01-19 NOTE — ED Triage Notes (Signed)
Patient arrived stating that she was diagnosed with covid-19 on Friday, states she is having generalized body aches and shortness of breath. Patient reports her last dose of Tylenol today at 12pm but that "it hasnt been working"

## 2020-01-19 NOTE — ED Notes (Signed)
Report given to receiving floor RN.  Pt will be transported to floor once bariatric bed delivered to assigned room on floor.

## 2020-01-19 NOTE — Progress Notes (Signed)
PROGRESS NOTE  Brief Narrative: Tamara Rowland is a 39 y.o. female with a history of morbid obesity, prediabetes, and covid-19 diagnosed 12/16 who presented with fever, cough, and progressive dyspnea. CX demonstrated peripheral patchy bilateral infiltrates and she was hypoxic on room air. CRP 12.7, PCT negative, hgb 9.8g/dl with microcytic indices. She was admitted this morning by Dr. Leafy Half and started on remdesivir and steroids.   Subjective: Feels about the same from admission (saw her about 2 hours after admission, still in ED). Shortness of breath worse with exertion, but still able to get to bedside chair. Very hungry.   Objective: BP 118/64 (BP Location: Right Arm)   Pulse 98   Temp 98.7 F (37.1 C) (Oral)   Resp (!) 22   Ht 5\' 2"  (1.575 m)   Wt (!) 164 kg   SpO2 97%   BMI 66.13 kg/m   Gen: Obese female in no acute distress Pulm: Tachypneic, nonlabored, distant.  CV: RRR, no pitting edema  Assessment & Plan: Acute hypoxemic respiratory failure due to covid-19 pneumonia, possibly also OHS: SARS-CoV-2 PCR positive on 12/16, +hypoxia and infiltrates on CXR 12/21.  - Continue remdesivir (planning 12/21 - 12/25) - Continue steroids. With significant CRP elevation and hypoxia, will switch back to solumedrol at just over 1mg /kg/day total dose - No current indication for baricitinib - Encourage OOB, IS, FV, and awake proning if able - Continue airborne, contact precautions for 21 days from positive testing. - Monitor CMP and inflammatory markers - Enoxaparin prophylactic dose, 0.5mg /kg q24h.  - Received IVF, able to take better po. DC IVF to maintain restrictive fluid strategy.  Prediabetes:  - HbA1c ordered - Augment insulin AC/HS with anticipation of steroid-induced hyperglycemia and insulin resistance. - Added lingagliptin - Carb-modified diet  Morbid obesity: BMI >65. Very poor prognostic indicator.   OAB:  - Continue myrbetriq  Hypokalemia:  - Supplemented.    Microcytic anemia: Chronic microcytosis. No bleeding. Ferritin is 91, unreliable in this clinical setting. - Further work up as outpatient. Will monitor.  Patient will require ongoing hospitalization, admitted to inpatient.  11-19-1979, MD Pager on amion 01/19/2020, 10:15 AM

## 2020-01-19 NOTE — ED Notes (Signed)
Pt found sitting in chair across room with increased respiratory rate and work of breathing. Pt convinced to return to bed and new vitals obtained. 02 at 77% room air. Placed on 2L [place] and sats return to 96%.

## 2020-01-19 NOTE — ED Provider Notes (Signed)
[facility]-EMERGENCY DEPT Provider Note   CSN: [id number] Arrival date & time: 01/19/20  0016     History Chief Complaint  Patient presents with  . Shortness of Breath    Tamara Rowland is a 39 y.o. female with a history of morbid obesity, prediabetes who presents to the emergency department with a chief complaint of shortness of breath.  The patient tested positive for COVID-19 on 12/16 after she presented to the ER endorsing upper respiratory symptoms for the last 2 and half weeks.  Unfortunately, this was after receiving her second dose of the Pfizer vaccine on 12/14.   She reports that over the last 5 days that she has had fevers, chills, myalgias, fatigue, generalized weakness, shortness of breath, chest pain, and cough.  She characterizes the chest pain as pressure-like and pleuritic.  Shortness of breath is worse with exertion, but she is also feeling short of breath at rest.  States that she has felt so weak over the last few days that she has barely able to stand and walk across the room.  She has been taking Tylenol for and OTC cough medication for her symptoms.  Last dose was at noon.  She also states that she has noticed some abdominal pain when she is taking a deep breath and has been having nausea and poor appetite.  No vomiting, diarrhea, constipation, rash, neck pain or stiffness, syncope.  She is a never smoker.   The history is provided by the patient and medical records. No language interpreter was used.       Past Medical History:  Diagnosis Date  . Anemia 2003   ongoing, has had heavy periods, iron deficiency  . Dysmenorrhea   . Nexplanon in place 01/2012  . Obesity     Patient Active Problem List   Diagnosis Date Noted  . COVID-19 virus infection 01/19/2020  . Polydipsia 11/20/2019  . BMI 60.0-69.9, adult (HCC) 11/20/2019  . DOE (dyspnea on exertion) 08/12/2019  . Edema of both lower extremities 08/12/2019  . Impaired fasting blood  sugar 08/12/2019  . Vitamin D deficiency 07/20/2019  . Folic acid deficiency 07/20/2019  . Witnessed apneic spells 07/20/2019  . Daytime somnolence 07/20/2019  . Snoring 07/20/2019  . Abnormal urine odor 07/20/2019  . Dysuria 07/20/2019  . Weight gain 07/20/2019  . Obesity with serious comorbidity 07/20/2019  . Iron deficiency 07/20/2019  . Prediabetes 10/27/2018  . Upper back pain, chronic 07/16/2018  . Breast hypertrophy in female 07/16/2018  . Dysmenorrhea 08/15/2015  . Iron deficiency anemia 08/15/2015  . Nexplanon in place 08/15/2015  . Morbid obesity (HCC) 08/15/2015  . Routine general medical examination at a health care facility 08/15/2015  . Screening for condition 08/15/2015  . Urinary frequency 08/15/2015  . Family history of thyroid disease 08/15/2015  . Vaginal discharge 08/15/2015    Past Surgical History:  Procedure Laterality Date  . DILATION AND CURETTAGE OF UTERUS  2013  . INSERTION OF CONTRACEPTIVE CAPSULE  01/2012   nexplanon     OB History   No obstetric history on file.     Family History  Problem Relation Age of Onset  . Hypertension Mother   . Obesity Mother   . Diabetes Maternal Aunt   . Cancer Maternal Grandmother        stomach  . Diabetes Maternal Grandmother   . Hypertension Maternal Grandmother   . Cancer Other        stomach, cervical, bone, prostate, distant relatives  .  Diabetes Maternal Aunt   . Stroke Neg Hx   . Heart disease Neg Hx     Social History   Tobacco Use  . Smoking status: Never Smoker  . Smokeless tobacco: Never Used  Vaping Use  . Vaping Use: Never used  Substance Use Topics  . Alcohol use: No    Alcohol/week: 0.0 standard drinks  . Drug use: No    Home Medications Prior to Admission medications   Medication Sig Start Date End Date Taking? Authorizing Provider  mirabegron ER (MYRBETRIQ) 25 MG TB24 tablet Take 1 tablet (25 mg total) by mouth daily. Overactive bladder 11/20/19  Yes Tysinger, Kermit Baloavid S,  PA-C  Semaglutide,0.25 or 0.5MG /DOS, (OZEMPIC, 0.25 OR 0.5 MG/DOSE,) 2 MG/1.5ML SOPN Inject 0.5 mg into the skin once a week. 11/20/19  Yes Tysinger, Kermit Baloavid S, PA-C  albuterol (VENTOLIN HFA) 108 (90 Base) MCG/ACT inhaler Inhale 2 puffs into the lungs every 4 (four) hours as needed for wheezing or shortness of breath. 01/15/20   Tilden Fossaees, Elizabeth, MD  benzonatate (TESSALON) 100 MG capsule Take 1 capsule (100 mg total) by mouth every 8 (eight) hours. 01/15/20   Tilden Fossaees, Elizabeth, MD  fluconazole (DIFLUCAN) 150 MG tablet Take 1 tablet (150 mg total) by mouth once a week. Patient not taking: Reported on 01/19/2020 11/23/19   Tysinger, Kermit Baloavid S, PA-C  Insulin Pen Needle (BD PEN NEEDLE NANO U/F) 32G X 4 MM MISC 1 each by Does not apply route at bedtime. Patient not taking: No sig reported 08/13/19   Tysinger, Kermit Baloavid S, PA-C  Insulin Pen Needle (BD PEN NEEDLE NANO U/F) 32G X 4 MM MISC 1 each by Does not apply route at bedtime. 11/20/19   Tysinger, Kermit Baloavid S, PA-C  sulfamethoxazole-trimethoprim (BACTRIM DS) 800-160 MG tablet Take 1 tablet by mouth 2 (two) times daily. Patient not taking: Reported on 01/19/2020 12/28/19   Jac Canavanysinger, David S, PA-C    Allergies    Patient has no known allergies.  Review of Systems   Review of Systems  Constitutional: Positive for chills, fatigue and fever. Negative for activity change.  HENT: Negative for sore throat.   Eyes: Negative for visual disturbance.  Respiratory: Positive for cough, chest tightness and shortness of breath.   Cardiovascular: Positive for chest pain.  Gastrointestinal: Positive for abdominal pain and nausea. Negative for vomiting.  Genitourinary: Negative for dysuria and vaginal pain.  Musculoskeletal: Positive for myalgias. Negative for back pain, neck pain and neck stiffness.  Skin: Negative for rash.  Allergic/Immunologic: Negative for immunocompromised state.  Neurological: Positive for weakness. Negative for dizziness, syncope and headaches.   Psychiatric/Behavioral: Negative for confusion.    Physical Exam Updated Vital Signs BP 139/86   Pulse (!) 101   Temp 99.4 F (37.4 C) (Oral)   Resp (!) 30   SpO2 97%   Physical Exam Vitals and nursing note reviewed.  Constitutional:      General: She is not in acute distress.    Appearance: She is obese. She is not toxic-appearing or diaphoretic.  HENT:     Head: Normocephalic.  Eyes:     Conjunctiva/sclera: Conjunctivae normal.  Neck:     Comments: No meningismus Cardiovascular:     Rate and Rhythm: Regular rhythm. Tachycardia present.     Heart sounds: No murmur heard. No friction rub. No gallop.   Pulmonary:     Effort: Pulmonary effort is normal. No respiratory distress.     Comments: Lung sounds are diminished throughout. Chest:  Chest wall: No tenderness.  Abdominal:     General: There is no distension.     Palpations: Abdomen is soft. There is no mass.     Tenderness: There is no abdominal tenderness. There is no right CVA tenderness, left CVA tenderness, guarding or rebound.     Hernia: No hernia is present.  Musculoskeletal:     Cervical back: Normal range of motion and neck supple.     Right lower leg: No edema.     Left lower leg: No edema.  Skin:    General: Skin is warm.     Coloration: Skin is not jaundiced or pale.     Findings: No rash.  Neurological:     Mental Status: She is alert.  Psychiatric:        Behavior: Behavior normal.     ED Results / Procedures / Treatments   Labs (all labs ordered are listed, but only abnormal results are displayed) Labs Reviewed  CBC WITH DIFFERENTIAL/PLATELET - Abnormal; Notable for the following components:      Result Value   Hemoglobin 9.8 (*)    HCT 32.9 (*)    MCV 76.2 (*)    MCH 22.7 (*)    MCHC 29.8 (*)    RDW 17.6 (*)    All other components within normal limits  COMPREHENSIVE METABOLIC PANEL - Abnormal; Notable for the following components:   Potassium 3.3 (*)    Glucose, Bld 105 (*)     Calcium 8.1 (*)    Albumin 3.2 (*)    AST 14 (*)    All other components within normal limits  FIBRINOGEN - Abnormal; Notable for the following components:   Fibrinogen 561 (*)    All other components within normal limits  C-REACTIVE PROTEIN - Abnormal; Notable for the following components:   CRP 12.7 (*)    All other components within normal limits  CULTURE, BLOOD (ROUTINE X 2)  CULTURE, BLOOD (ROUTINE X 2)  LACTIC ACID, PLASMA  D-DIMER, QUANTITATIVE (NOT AT [facility])  LACTATE DEHYDROGENASE  FERRITIN  TRIGLYCERIDES  LACTIC ACID, PLASMA  PROCALCITONIN  BRAIN NATRIURETIC PEPTIDE  BLOOD GAS, VENOUS  I-STAT BETA HCG BLOOD, ED ([facility], [facility], [facility] ONLY)    EKG None  Radiology DG Chest 2 View  Result Date: 01/19/2020 CLINICAL DATA:  COVID positive with weakness and shortness of breath. EXAM: CHEST - 2 VIEW COMPARISON:  January 14, 2020 FINDINGS: Mild to moderate severity ill-defined multifocal infiltrates are seen within the mid and lower lung fields, bilaterally. There is no evidence of a pleural effusion or pneumothorax. The heart size and mediastinal contours are within normal limits. The visualized skeletal structures are unremarkable. IMPRESSION: Mild to moderate severity bilateral multifocal infiltrates. Electronically Signed   By: Aram Candela M.D.   On: 01/19/2020 01:14    Procedures .Critical Care Performed by: Barkley Boards, PA-C Authorized by: Barkley Boards, PA-C   Critical care provider statement:    Critical care time (minutes):  40   Critical care time was exclusive of:  Separately billable procedures and treating other patients and teaching time   Critical care was necessary to treat or prevent imminent or life-threatening deterioration of the following conditions:  Respiratory failure   Critical care was time spent personally by me on the following activities:  Ordering and performing treatments and interventions, ordering and review of laboratory studies,  ordering and review of radiographic studies, pulse oximetry, re-evaluation of patient's condition, obtaining history from patient or  surrogate, examination of patient, evaluation of patient's response to treatment, development of treatment plan with patient or surrogate and review of old charts   I assumed direction of critical care for this patient from another provider in my specialty: no     Care discussed with: admitting provider     (including critical care time)  Medications Ordered in ED Medications  potassium chloride SA (KLOR-CON) CR tablet 40 mEq (has no administration in time range)  methylPREDNISolone sodium succinate (SOLU-MEDROL) 125 mg/2 mL injection 125 mg (has no administration in time range)  albuterol (VENTOLIN HFA) 108 (90 Base) MCG/ACT inhaler 4 puff (has no administration in time range)  lactated ringers bolus 1,000 mL (has no administration in time range)    Followed by  lactated ringers infusion (has no administration in time range)  acetaminophen (TYLENOL) tablet 650 mg (650 mg Oral Given 01/19/20 0052)    ED Course  I have reviewed the triage vital signs and the nursing notes.  Pertinent labs & imaging results that were available during my care of the patient were reviewed by me and considered in my medical decision making (see chart for details).  Clinical Course as of 01/19/20 3016  Tue Jan 19, 2020  0109 Patient ambulated in the room.  She was very tachypneic and had oxygen desaturation to 87%. [MM]  [phone] Notified by RN that patient stood up to move into the bedside chair she was uncomfortable to the bed.  RN noted that patient's oxygen saturation was 78% with good waveform on the monitor with minimal exertion and she was placed on 2 L nasal cannula. [MM]    Clinical Course User Index [MM] Zoya Sprecher, Coral Else, PA-C   MDM Rules/Calculators/A&P                          39 year old female with a history of prediabetes and morbid obesity who presents to the  emergency department with worsening shortness of breath, pleuritic chest pain, and URI symptoms after testing positive for COVID-19 on 12/16.   Initially febrile and tachycardic.  She defervesced after Tylenol, but continued to remain tachycardic in the 120s.  Labs and imaging have been reviewed and independently interpreted by me.  Chest x-ray with bilateral infiltrates in the mid and lower lung fields. CT PE  Labs are notable for anemia with hemoglobin of 9.8.  This is decreased from July, but she has a history of heavy menstrual cycles.  CRP is elevated to 12.7.  She has mild hypokalemia to 3.3 and oral potassium chloride has been ordered.  Labs are not suggestive of bacterial infection.  Will order Solu-Medrol and albuterol.   The patient has multiple risk factors for PE, including tachycardia that persisted despite defervescing, obesity, and increased mobilization over the last few days secondary to her symptoms from COVID-19.  However, given the D-dimer is normal when adjusted for age, this would make PE significantly unlikely.  Symptoms could also be secondary to undiagnosed obesity hypoventilation syndrome. Other COVID-19 labs are overall reassuring, given that she has had significant hypoxia (78% and 87%), with minimal exertion she will require admission.  Consult with the hospitalist team and Dr. Leafy Half will accept the patient for admission.   The patient appears reasonably stabilized for admission considering the current resources, flow, and capabilities available in the ED at this time, and I doubt any other [facility] requiring further screening and/or treatment in the ED prior to admission.  Final Clinical  Impression(s) / ED Diagnoses Final diagnoses:  Acute respiratory failure with hypoxia (HCC)  Pneumonia due to COVID-19 virus    Rx / DC Orders ED Discharge Orders    None       Barkley Boards, PA-C 01/19/20 1610    Marily Memos, MD 01/19/20 [id number]

## 2020-01-20 LAB — COMPREHENSIVE METABOLIC PANEL
ALT: 21 U/L (ref 0–44)
AST: 15 U/L (ref 15–41)
Albumin: 3.2 g/dL — ABNORMAL LOW (ref 3.5–5.0)
Alkaline Phosphatase: 62 U/L (ref 38–126)
Anion gap: 11 (ref 5–15)
BUN: 12 mg/dL (ref 6–20)
CO2: 24 mmol/L (ref 22–32)
Calcium: 8.4 mg/dL — ABNORMAL LOW (ref 8.9–10.3)
Chloride: 104 mmol/L (ref 98–111)
Creatinine, Ser: 0.59 mg/dL (ref 0.44–1.00)
GFR, Estimated: >60 mL/min (ref >60)
Glucose, Bld: 182 mg/dL — ABNORMAL HIGH (ref 70–99)
Potassium: 3.8 mmol/L (ref 3.5–5.1)
Sodium: 139 mmol/L (ref 135–145)
Total Bilirubin: 0.5 mg/dL (ref 0.3–1.2)
Total Protein: 7.7 g/dL (ref 6.5–8.1)

## 2020-01-20 LAB — CBC WITH DIFFERENTIAL/PLATELET
Abs Immature Granulocytes: 0.08 10*3/uL — ABNORMAL HIGH (ref 0.00–0.07)
Basophils Absolute: 0 10*3/uL (ref 0.0–0.1)
Basophils Relative: 0 %
Eosinophils Absolute: 0 10*3/uL (ref 0.0–0.5)
Eosinophils Relative: 0 %
HCT: 36.2 % (ref 36.0–46.0)
Hemoglobin: 10.9 g/dL — ABNORMAL LOW (ref 12.0–15.0)
Immature Granulocytes: 1 %
Lymphocytes Relative: 11 %
Lymphs Abs: 1.4 10*3/uL (ref 0.7–4.0)
MCH: 22.9 pg — ABNORMAL LOW (ref 26.0–34.0)
MCHC: 30.1 g/dL (ref 30.0–36.0)
MCV: 76.1 fL — ABNORMAL LOW (ref 80.0–100.0)
Monocytes Absolute: 0.3 10*3/uL (ref 0.1–1.0)
Monocytes Relative: 2 %
Neutro Abs: 10.8 10*3/uL — ABNORMAL HIGH (ref 1.7–7.7)
Neutrophils Relative %: 86 %
Platelets: 276 10*3/uL (ref 150–400)
RBC: 4.76 MIL/uL (ref 3.87–5.11)
RDW: 17.3 % — ABNORMAL HIGH (ref 11.5–15.5)
WBC: 12.6 10*3/uL — ABNORMAL HIGH (ref 4.0–10.5)
nRBC: 0 % (ref 0.0–0.2)

## 2020-01-20 LAB — GLUCOSE, CAPILLARY
Glucose-Capillary: 152 mg/dL — ABNORMAL HIGH (ref 70–99)
Glucose-Capillary: 177 mg/dL — ABNORMAL HIGH (ref 70–99)
Glucose-Capillary: 158 mg/dL — ABNORMAL HIGH (ref 70–99)
Glucose-Capillary: 126 mg/dL — ABNORMAL HIGH (ref 70–99)

## 2020-01-20 LAB — D-DIMER, QUANTITATIVE: D-Dimer, Quant: 0.71 ug/mL-FEU — ABNORMAL HIGH (ref 0.00–0.50)

## 2020-01-20 LAB — C-REACTIVE PROTEIN: CRP: 16.5 mg/dL — ABNORMAL HIGH (ref <1.0)

## 2020-01-20 NOTE — Progress Notes (Signed)
Pt sitting in recliner eating food from [place]. Pt continues to have NO IV access so IV team has been requested again as this has caused a mised dose methylprednisone

## 2020-01-20 NOTE — Progress Notes (Signed)
PROGRESS NOTE  Tamara Rowland  OAC:[id number] DOB: 10/22/1980 DOA: 01/19/2020 PCP: Jac Canavan, PA-C   Brief Narrative: Tamara Rowland is a 39 y.o. female with a history of morbid obesity, prediabetes, and covid-19 diagnosed 12/16 who presented with fever, cough, and progressive dyspnea. CX demonstrated peripheral patchy bilateral infiltrates and she was hypoxic on room air. CRP 12.7, PCT negative, hgb 9.8g/dl with microcytic indices. She was admitted and started on remdesivir and steroids. Continues to require 2L O2.  Assessment & Plan: Principal Problem:   COVID-19 virus infection Active Problems:   Prediabetes   Class 3 severe obesity due to excess calories with serious comorbidity and body mass index (BMI) of 60.0 to 69.9 in adult [facility])   Hypokalemia   Acute hypoxemic respiratory failure due to COVID-19 [facility])  Acute hypoxemic respiratory failure due to covid-19 pneumonia, possibly also OHS: SARS-CoV-2 PCR positive on 12/16, +hypoxia and infiltrates on CXR 12/21.  - Continue remdesivir (planning 12/21 - 12/25) - Continue steroids. Will augment slightly today with rising CRP.  - No current indication for baricitinib, though would have low threshold for this if hypoxia worsened at all. - Encourage OOB, IS, FV, and awake proning if able - Continue airborne, contact precautions for 21 days from positive testing. - Monitor CMP and inflammatory markers - Enoxaparin prophylactic dose, 0.5mg /kg q24h. D-dimer < 1.  Prediabetes: HbA1c 5.9%.  - Continue SSI AC/HS   - Added lingagliptin - Carb-modified diet  OAB:  - Continue myrbetriq  Hypokalemia:  - Monitoring intermittently.  Microcytic anemia: Chronic microcytosis. No bleeding. Ferritin is 91, unreliable in this clinical setting. Hgb stable. - Further work up as outpatient.   Morbid obesity: Estimated body mass index is 66.13 kg/m as calculated from the following:   Height as of this encounter: 5\' 2"  (1.575 m).   Weight as  of this encounter: 164 kg.  DVT prophylaxis: Lovenox Code Status: Full Family Communication: None at bedside Disposition Plan:  Status is: Inpatient  Remains inpatient appropriate because:Inpatient level of care appropriate due to severity of illness   Dispo: The patient is from: Home              Anticipated d/c is to: Home              Anticipated d/c date is: 3 days              Patient currently is not medically stable to d/c.  Consultants:   None  Procedures:   None  Antimicrobials:  Remdesivir   Subjective: Shortness of breath is mild, constant, worsens abruptly with any exertion. Overall, however, this is improved from yesterday. No chest pain or bleeding reported.  Objective: Vitals:   01/19/20 1314 01/19/20 1702 01/19/20 2120 01/20/20 0436  BP: 116/61 109/64 115/73 122/68  Pulse: 87 77 68 78  Resp: 20 (!) 21 18 18   Temp: 98 F (36.7 C) 98.7 F (37.1 C) 98.3 F (36.8 C) 98.1 F (36.7 C)  TempSrc:   Oral Oral  SpO2: 99% 100% 92% 94%  Weight:      Height:        Intake/Output Summary (Last 24 hours) at 01/20/2020 1206 Last data filed at 01/20/2020 0935 Gross per 24 hour  Intake 1298 ml  Output --  Net 1298 ml   Filed Weights   01/19/20 0906  Weight: (!) 164 kg    Gen: 39 y.o. female in no distress Pulm: Non-labored but tachypneic at rest with supplemental oxygen. Decreased.  CV: Regular rate and rhythm. No murmur, rub, or gallop. No JVD, no pitting edema. GI: Abdomen soft, non-tender, non-distended, with normoactive bowel sounds. No organomegaly or masses felt. Ext: Warm, no deformities Skin: No rashes, lesions ulcers Neuro: Alert and oriented. No focal neurological deficits. Psych: Judgement and insight appear normal. Mood & affect appropriate.   Data Reviewed: I have personally reviewed following labs and imaging studies  CBC: Recent Labs  Lab 01/19/20 0427 01/20/20 0424  WBC 9.3 12.6*  NEUTROABS 6.6 10.8*  HGB 9.8* 10.9*  HCT  32.9* 36.2  MCV 76.2* 76.1*  PLT 231 276   Basic Metabolic Panel: Recent Labs  Lab 01/19/20 0427 01/20/20 0424  NA 138 139  K 3.3* 3.8  CL 101 104  CO2 26 24  GLUCOSE 105* 182*  BUN 8 12  CREATININE 0.82 0.59  CALCIUM 8.1* 8.4*   GFR: Estimated Creatinine Clearance: 145.5 mL/min (by C-G formula based on SCr of 0.59 mg/dL). Liver Function Tests: Recent Labs  Lab 01/19/20 0427 01/20/20 0424  AST 14* 15  ALT 19 21  ALKPHOS 59 62  BILITOT 1.0 0.5  PROT 7.0 7.7  ALBUMIN 3.2* 3.2*   No results for input(s): LIPASE, AMYLASE in the last 168 hours. No results for input(s): AMMONIA in the last 168 hours. Coagulation Profile: No results for input(s): INR, PROTIME in the last 168 hours. Cardiac Enzymes: No results for input(s): CKTOTAL, CKMB, CKMBINDEX, TROPONINI in the last 168 hours. BNP (last 3 results) No results for input(s): PROBNP in the last 8760 hours. HbA1C: Recent Labs    01/19/20 0928  HGBA1C 5.9*   CBG: Recent Labs  Lab 01/19/20 1121 01/19/20 1658 01/19/20 2122 01/20/20 0737 01/20/20 1108  GLUCAP 140* 174* 145* 152* 177*   Lipid Profile: Recent Labs    01/19/20 0427  TRIG 46   Thyroid Function Tests: No results for input(s): TSH, T4TOTAL, FREET4, T3FREE, THYROIDAB in the last 72 hours. Anemia Panel: Recent Labs    01/19/20 0427  FERRITIN 91   Urine analysis:    Component Value Date/Time   LABSPEC 1.020 11/20/2019 1537   PHURINE 7.0 08/27/2017 1800   GLUCOSEU NEGATIVE 08/27/2017 1800   HGBUR SMALL (A) 08/27/2017 1800   BILIRUBINUR small (A) 11/20/2019 1537   BILIRUBINUR n 08/15/2015 1426   KETONESUR negative 11/20/2019 1537   KETONESUR NEGATIVE 08/27/2017 1800   PROTEINUR trace (A) 11/20/2019 1537   PROTEINUR NEGATIVE 08/27/2017 1800   UROBILINOGEN 0.2 08/27/2017 1800   NITRITE Negative 11/20/2019 1537   NITRITE NEGATIVE 08/27/2017 1800   LEUKOCYTESUR Negative 11/20/2019 1537   Recent Results (from the past 240 hour(s))  Resp  Panel by RT-PCR (Flu A&B, Covid) Nasopharyngeal Swab     Status: Abnormal   Collection Time: 01/14/20 11:16 PM   Specimen: Nasopharyngeal Swab; Nasopharyngeal(NP) swabs in vial transport medium  Result Value Ref Range Status   SARS Coronavirus 2 by RT PCR POSITIVE (A) NEGATIVE Final    Comment: RESULT CALLED TO, READ BACK BY AND VERIFIED WITH: NASH J. 12.17.21 @ 0032 BY MECIAL J. (NOTE) SARS-CoV-2 target nucleic acids are DETECTED.  The SARS-CoV-2 RNA is generally detectable in upper respiratory specimens during the acute phase of infection. Positive results are indicative of the presence of the identified virus, but do not rule out bacterial infection or co-infection with other pathogens not detected by the test. Clinical correlation with patient history and other diagnostic information is necessary to determine patient infection status. The expected result is Negative.  Fact Sheet for Patients: BloggerCourse.com  Fact Sheet for Healthcare Providers: SeriousBroker.it  This test is not yet approved or cleared by the [place] FDA and  has been authorized for detection and/or diagnosis of SARS-CoV-2 by FDA under an Emergency Use Authorization (EUA).  This EUA will remain in effect (meaning this test can  be used) for the duration of  the COVID-19 declaration under Section 564(b)(1) of the Act, 21 U.S.C. section 360bbb-3(b)(1), unless the authorization is terminated or revoked sooner.     Influenza A by PCR NEGATIVE NEGATIVE Final   Influenza B by PCR NEGATIVE NEGATIVE Final    Comment: (NOTE) The Xpert Xpress SARS-CoV-2/FLU/RSV plus assay is intended as an aid in the diagnosis of influenza from Nasopharyngeal swab specimens and should not be used as a sole basis for treatment. Nasal washings and aspirates are unacceptable for Xpert Xpress SARS-CoV-2/FLU/RSV testing.  Fact Sheet for  Patients: BloggerCourse.com  Fact Sheet for Healthcare Providers: SeriousBroker.it  This test is not yet approved or cleared by the [place] FDA and has been authorized for detection and/or diagnosis of SARS-CoV-2 by FDA under an Emergency Use Authorization (EUA). This EUA will remain in effect (meaning this test can be used) for the duration of the COVID-19 declaration under Section 564(b)(1) of the Act, 21 U.S.C. section 360bbb-3(b)(1), unless the authorization is terminated or revoked.  Performed at [facility], [address], [place], [place] [address]       Radiology Studies: DG Chest 2 View  Result Date: 01/19/2020 CLINICAL DATA:  COVID positive with weakness and shortness of breath. EXAM: CHEST - 2 VIEW COMPARISON:  January 14, 2020 FINDINGS: Mild to moderate severity ill-defined multifocal infiltrates are seen within the mid and lower lung fields, bilaterally. There is no evidence of a pleural effusion or pneumothorax. The heart size and mediastinal contours are within normal limits. The visualized skeletal structures are unremarkable. IMPRESSION: Mild to moderate severity bilateral multifocal infiltrates. Electronically Signed   By: Aram Candela M.D.   On: 01/19/2020 01:14    Scheduled Meds: . vitamin C  500 mg Oral Daily  . enoxaparin (LOVENOX) injection  80 mg Subcutaneous Q24H  . insulin aspart  0-20 Units Subcutaneous TID WC  . insulin aspart  0-5 Units Subcutaneous QHS  . linagliptin  5 mg Oral Daily  . methylPREDNISolone (SOLU-MEDROL) injection  60 mg Intravenous Q8H  . mirabegron ER  25 mg Oral Daily  . zinc sulfate  220 mg Oral Daily   Continuous Infusions: . remdesivir 100 mg in NS 100 mL 100 mg (01/20/20 1016)     LOS: 1 day   Time spent: 25 minutes.  Tyrone Nine, MD [facility] [email or web address] 01/20/2020, 12:06 PM

## 2020-01-21 LAB — COMPREHENSIVE METABOLIC PANEL
ALT: 19 U/L (ref 0–44)
AST: 12 U/L — ABNORMAL LOW (ref 15–41)
Albumin: 3 g/dL — ABNORMAL LOW (ref 3.5–5.0)
Alkaline Phosphatase: 57 U/L (ref 38–126)
Anion gap: 11 (ref 5–15)
BUN: 18 mg/dL (ref 6–20)
CO2: 26 mmol/L (ref 22–32)
Calcium: 8.8 mg/dL — ABNORMAL LOW (ref 8.9–10.3)
Chloride: 106 mmol/L (ref 98–111)
Creatinine, Ser: 0.68 mg/dL (ref 0.44–1.00)
GFR, Estimated: >60 mL/min (ref >60)
Glucose, Bld: 151 mg/dL — ABNORMAL HIGH (ref 70–99)
Potassium: 3.9 mmol/L (ref 3.5–5.1)
Sodium: 143 mmol/L (ref 135–145)
Total Bilirubin: 0.2 mg/dL — ABNORMAL LOW (ref 0.3–1.2)
Total Protein: 7.4 g/dL (ref 6.5–8.1)

## 2020-01-21 LAB — CBC WITH DIFFERENTIAL/PLATELET
Abs Immature Granulocytes: 0.19 10*3/uL — ABNORMAL HIGH (ref 0.00–0.07)
Basophils Absolute: 0 10*3/uL (ref 0.0–0.1)
Basophils Relative: 0 %
Eosinophils Absolute: 0 10*3/uL (ref 0.0–0.5)
Eosinophils Relative: 0 %
HCT: 35.9 % — ABNORMAL LOW (ref 36.0–46.0)
Hemoglobin: 10.6 g/dL — ABNORMAL LOW (ref 12.0–15.0)
Immature Granulocytes: 1 %
Lymphocytes Relative: 8 %
Lymphs Abs: 1.5 10*3/uL (ref 0.7–4.0)
MCH: 22.6 pg — ABNORMAL LOW (ref 26.0–34.0)
MCHC: 29.5 g/dL — ABNORMAL LOW (ref 30.0–36.0)
MCV: 76.5 fL — ABNORMAL LOW (ref 80.0–100.0)
Monocytes Absolute: 0.6 10*3/uL (ref 0.1–1.0)
Monocytes Relative: 3 %
Neutro Abs: 16.8 10*3/uL — ABNORMAL HIGH (ref 1.7–7.7)
Neutrophils Relative %: 88 %
Platelets: 298 10*3/uL (ref 150–400)
RBC: 4.69 MIL/uL (ref 3.87–5.11)
RDW: 17.6 % — ABNORMAL HIGH (ref 11.5–15.5)
WBC: 19.1 10*3/uL — ABNORMAL HIGH (ref 4.0–10.5)
nRBC: 0 % (ref 0.0–0.2)

## 2020-01-21 LAB — GLUCOSE, CAPILLARY
Glucose-Capillary: 137 mg/dL — ABNORMAL HIGH (ref 70–99)
Glucose-Capillary: 163 mg/dL — ABNORMAL HIGH (ref 70–99)
Glucose-Capillary: 139 mg/dL — ABNORMAL HIGH (ref 70–99)
Glucose-Capillary: 153 mg/dL — ABNORMAL HIGH (ref 70–99)

## 2020-01-21 LAB — D-DIMER, QUANTITATIVE: D-Dimer, Quant: 0.29 ug/mL-FEU (ref 0.00–0.50)

## 2020-01-21 LAB — C-REACTIVE PROTEIN: CRP: 8.2 mg/dL — ABNORMAL HIGH (ref <1.0)

## 2020-01-21 NOTE — Progress Notes (Signed)
PROGRESS NOTE  Tamara Rowland  UEA:[id number] DOB: 10/19/1980 DOA: 01/19/2020 PCP: Jac Canavanysinger, David S, PA-C   Brief Narrative: Tamara Rowland is a 39 y.o. female with a history of morbid obesity, prediabetes, and covid-19 diagnosed 12/16 who presented with fever, cough, and progressive dyspnea. CX demonstrated peripheral patchy bilateral infiltrates and she was hypoxic on room air. CRP 12.7, PCT negative, hgb 9.8g/dl with microcytic indices. She was admitted and started on remdesivir and steroids. Continues to require 2L O2.  Assessment & Plan: Principal Problem:   COVID-19 virus infection Active Problems:   Prediabetes   Class 3 severe obesity due to excess calories with serious comorbidity and body mass index (BMI) of 60.0 to 69.9 in adult [facility](HCC)   Hypokalemia   Acute hypoxemic respiratory failure due to COVID-19 [facility](HCC)  Acute hypoxemic respiratory failure due to covid-19 pneumonia, possibly also OHS: SARS-CoV-2 PCR positive on 12/16, +hypoxia and infiltrates on CXR 12/21.  - Continue remdesivir (planning 12/21 - 12/25). CRP responding, still grossly elevated. - Continue augmented steroids, taper likely in next 24-48 hours. - No current indication for baricitinib, though would have low threshold for this if hypoxia worsened at all. - Encourage OOB, IS, FV, and awake proning if able - Continue airborne, contact precautions for 21 days from positive testing. - Monitor CMP and inflammatory markers - Enoxaparin prophylactic dose, 0.5mg /kg q24h. D-dimer < 1.  Prediabetes: HbA1c 5.9%.  - Continue SSI AC/HS   - Added lingagliptin - Carb-modified diet  OAB:  - Continue myrbetriq  Hypokalemia:  - Monitoring intermittently.  Microcytic anemia: Chronic microcytosis. No bleeding. Ferritin is 91, unreliable in this clinical setting. Hgb stable. - Further work up as outpatient.   Morbid obesity: Estimated body mass index is 66.13 kg/m as calculated from the following:   Height as of this  encounter: 5\' 2"  (1.575 m).   Weight as of this encounter: 164 kg.  DVT prophylaxis: Lovenox Code Status: Full Family Communication: None at bedside Disposition Plan:  Status is: Inpatient  Remains inpatient appropriate because:Inpatient level of care appropriate due to severity of illness   Dispo: The patient is from: Home              Anticipated d/c is to: Home              Anticipated d/c date is: 2 days              Patient currently is not medically stable to d/c.  Consultants:   None  Procedures:   None  Antimicrobials:  Remdesivir 12/21 - 12/25  Subjective: Still hypoxic at rest and with exertion, though dyspnea is becoming less severe with that. No chest pain or other complaints. Can only pull ~200cc on IS until she coughs and therefore doesn't want to use it.   Objective: Vitals:   01/20/20 2003 01/21/20 0000 01/21/20 0030 01/21/20 0501  BP: 111/70   (!) 139/105  Pulse: 70   (!) 101  Resp: 18   18  Temp: 98 F (36.7 C)   98.1 F (36.7 C)  TempSrc: Oral     SpO2: 100% (!) 89% 93%   Weight:      Height:        Intake/Output Summary (Last 24 hours) at 01/21/2020 1331 Last data filed at 01/21/2020 1013 Gross per 24 hour  Intake 690 ml  Output --  Net 690 ml   Filed Weights   01/19/20 0906  Weight: (!) 164 kg   Gen: 39 y.o.  female in no distress Pulm: Nonlabored breathing supplemental oxygen at rest. Distant. CV: Regular rate and rhythm. No murmur, rub, or gallop. No JVD, no pitting dependent edema. GI: Abdomen soft, non-tender, non-distended, with normoactive bowel sounds.  Ext: Warm, no deformities Skin: No rashes, lesions or ulcers on visualized skin. Neuro: Alert and oriented. No focal neurological deficits. Psych: Judgement and insight appear fair. Mood euthymic & affect congruent. Behavior is appropriate.    Data Reviewed: I have personally reviewed following labs and imaging studies  CBC: Recent Labs  Lab 01/19/20 0427 01/20/20 0424  01/21/20 0405  WBC 9.3 12.6* 19.1*  NEUTROABS 6.6 10.8* 16.8*  HGB 9.8* 10.9* 10.6*  HCT 32.9* 36.2 35.9*  MCV 76.2* 76.1* 76.5*  PLT 231 276 298   Basic Metabolic Panel: Recent Labs  Lab 01/19/20 0427 01/20/20 0424 01/21/20 0405  NA 138 139 143  K 3.3* 3.8 3.9  CL 101 104 106  CO2 26 24 26   GLUCOSE 105* 182* 151*  BUN 8 12 18   CREATININE 0.82 0.59 0.68  CALCIUM 8.1* 8.4* 8.8*   GFR: Estimated Creatinine Clearance: 145.5 mL/min (by C-G formula based on SCr of 0.68 mg/dL). Liver Function Tests: Recent Labs  Lab 01/19/20 0427 01/20/20 0424 01/21/20 0405  AST 14* 15 12*  ALT 19 21 19   ALKPHOS 59 62 57  BILITOT 1.0 0.5 0.2*  PROT 7.0 7.7 7.4  ALBUMIN 3.2* 3.2* 3.0*   No results for input(s): LIPASE, AMYLASE in the last 168 hours. No results for input(s): AMMONIA in the last 168 hours. Coagulation Profile: No results for input(s): INR, PROTIME in the last 168 hours. Cardiac Enzymes: No results for input(s): CKTOTAL, CKMB, CKMBINDEX, TROPONINI in the last 168 hours. BNP (last 3 results) No results for input(s): PROBNP in the last 8760 hours. HbA1C: Recent Labs    01/19/20 0928  HGBA1C 5.9*   CBG: Recent Labs  Lab 01/20/20 1108 01/20/20 1702 01/20/20 2048 01/21/20 0741 01/21/20 1148  GLUCAP 177* 158* 126* 137* 163*   Lipid Profile: Recent Labs    01/19/20 0427  TRIG 46   Thyroid Function Tests: No results for input(s): TSH, T4TOTAL, FREET4, T3FREE, THYROIDAB in the last 72 hours. Anemia Panel: Recent Labs    01/19/20 0427  FERRITIN 91   Urine analysis:    Component Value Date/Time   LABSPEC 1.020 11/20/2019 1537   PHURINE 7.0 08/27/2017 1800   GLUCOSEU NEGATIVE 08/27/2017 1800   HGBUR SMALL (A) 08/27/2017 1800   BILIRUBINUR small (A) 11/20/2019 1537   BILIRUBINUR n 08/15/2015 1426   KETONESUR negative 11/20/2019 1537   KETONESUR NEGATIVE 08/27/2017 1800   PROTEINUR trace (A) 11/20/2019 1537   PROTEINUR NEGATIVE 08/27/2017 1800    UROBILINOGEN 0.2 08/27/2017 1800   NITRITE Negative 11/20/2019 1537   NITRITE NEGATIVE 08/27/2017 1800   LEUKOCYTESUR Negative 11/20/2019 1537   Recent Results (from the past 240 hour(s))  Resp Panel by RT-PCR (Flu A&B, Covid) Nasopharyngeal Swab     Status: Abnormal   Collection Time: 01/14/20 11:16 PM   Specimen: Nasopharyngeal Swab; Nasopharyngeal(NP) swabs in vial transport medium  Result Value Ref Range Status   SARS Coronavirus 2 by RT PCR POSITIVE (A) NEGATIVE Final    Comment: RESULT CALLED TO, READ BACK BY AND VERIFIED WITH: NASH J. 12.17.21 @ 0032 BY MECIAL J. (NOTE) SARS-CoV-2 target nucleic acids are DETECTED.  The SARS-CoV-2 RNA is generally detectable in upper respiratory specimens during the acute phase of infection. Positive results are indicative of the  presence of the identified virus, but do not rule out bacterial infection or co-infection with other pathogens not detected by the test. Clinical correlation with patient history and other diagnostic information is necessary to determine patient infection status. The expected result is Negative.  Fact Sheet for Patients: BloggerCourse.com  Fact Sheet for Healthcare Providers: SeriousBroker.it  This test is not yet approved or cleared by the [place] FDA and  has been authorized for detection and/or diagnosis of SARS-CoV-2 by FDA under an Emergency Use Authorization (EUA).  This EUA will remain in effect (meaning this test can  be used) for the duration of  the COVID-19 declaration under Section 564(b)(1) of the Act, 21 U.S.C. section 360bbb-3(b)(1), unless the authorization is terminated or revoked sooner.     Influenza A by PCR NEGATIVE NEGATIVE Final   Influenza B by PCR NEGATIVE NEGATIVE Final    Comment: (NOTE) The Xpert Xpress SARS-CoV-2/FLU/RSV plus assay is intended as an aid in the diagnosis of influenza from Nasopharyngeal swab specimens  and should not be used as a sole basis for treatment. Nasal washings and aspirates are unacceptable for Xpert Xpress SARS-CoV-2/FLU/RSV testing.  Fact Sheet for Patients: BloggerCourse.com  Fact Sheet for Healthcare Providers: SeriousBroker.it  This test is not yet approved or cleared by the [place] FDA and has been authorized for detection and/or diagnosis of SARS-CoV-2 by FDA under an Emergency Use Authorization (EUA). This EUA will remain in effect (meaning this test can be used) for the duration of the COVID-19 declaration under Section 564(b)(1) of the Act, 21 U.S.C. section 360bbb-3(b)(1), unless the authorization is terminated or revoked.  Performed at [facility], [address] [place], [place] [address]   Blood Culture (routine x 2)     Status: None (Preliminary result)   Collection Time: 01/19/20  4:27 AM   Specimen: BLOOD  Result Value Ref Range Status   Specimen Description   Final    BLOOD RIGHT ANTECUBITAL Performed at [facility], [address] [place], [place] [address]    Special Requests   Final    BOTTLES DRAWN AEROBIC AND ANAEROBIC Blood Culture adequate volume Performed at [facility], [address] [place], [place] [address]    Culture   Final    NO GROWTH 2 DAYS Performed at [facility], [address], [place], [place] [address]    Report Status PENDING  Incomplete  Blood Culture (routine x 2)     Status: None (Preliminary result)   Collection Time: 01/19/20  4:27 AM   Specimen: BLOOD  Result Value Ref Range Status   Specimen Description   Final    BLOOD LEFT ANTECUBITAL Performed at [facility], [address] [place], [place] [address]    Special Requests   Final    BOTTLES DRAWN AEROBIC AND ANAEROBIC Blood Culture adequate volume Performed at [facility], [address], [place], [place] [address]    Culture   Final    NO GROWTH 2 DAYS Performed at [facility], [address] [place], [place] [address]    Report Status PENDING  Incomplete      Radiology Studies: No results found.  Scheduled Meds: . vitamin C  500 mg Oral Daily  . enoxaparin (LOVENOX) injection  80 mg Subcutaneous Q24H  . insulin aspart  0-20 Units Subcutaneous TID WC  . insulin aspart  0-5 Units Subcutaneous QHS  . linagliptin  5 mg Oral Daily  . methylPREDNISolone (SOLU-MEDROL) injection  60 mg Intravenous Q8H  . mirabegron ER  25 mg Oral Daily  . zinc sulfate  220 mg Oral Daily   Continuous Infusions: . remdesivir 100 mg in NS 100 mL 100 mg (01/21/20 1023)     LOS: 2 days   Time spent: 25 minutes.  Tyrone Nine, MD [facility] [email or web address] 01/21/2020, 1:31 PM

## 2020-01-21 NOTE — Progress Notes (Signed)
Pt was provided supplies so she may shower.

## 2020-01-21 NOTE — Progress Notes (Signed)
SATURATION QUALIFICATIONS: (This note is used to comply with regulatory documentation for home oxygen)  Patient Saturations on Room Air at Rest = 89%  Patient Saturations on Room Air while Ambulating = 84%  Patient Saturations on 2 Liters of oxygen while Ambulating = 93%  Please briefly explain why patient needs home oxygen: Patient will need home O2 in order to maintain adequate oxygenation >92%.

## 2020-01-21 NOTE — Plan of Care (Signed)
Pt acknowledges the need for hand hygiene, oral care & deep breathing exercises

## 2020-01-22 LAB — C-REACTIVE PROTEIN: CRP: 4.5 mg/dL — ABNORMAL HIGH (ref <1.0)

## 2020-01-22 LAB — GLUCOSE, CAPILLARY
Glucose-Capillary: 122 mg/dL — ABNORMAL HIGH (ref 70–99)
Glucose-Capillary: 132 mg/dL — ABNORMAL HIGH (ref 70–99)

## 2020-01-22 MED ORDER — ASCORBIC ACID 1000 MG PO TABS: 1000.0000 mg | ORAL_TABLET | Freq: Every day | ORAL | 0 refills | Status: DC

## 2020-01-22 MED ORDER — ZINC SULFATE 220 (50 ZN) MG PO CAPS: 220.0000 mg | ORAL_CAPSULE | Freq: Every day | ORAL | 0 refills | Status: DC

## 2020-01-22 MED ORDER — DEXAMETHASONE 6 MG PO TABS: 6.0000 mg | ORAL_TABLET | Freq: Every day | ORAL | 0 refills | Status: AC

## 2020-01-22 NOTE — Progress Notes (Signed)
Pt was discharged going home with 2L of O2. Discharge instructions given to patient and she stated an understanding. She was alert and Ox4. All belongings were returned

## 2020-01-22 NOTE — Discharge Summary (Signed)
Physician Discharge Summary  Tamara Rowland ZOX:[id number] DOB: 05/29/1980 DOA: 01/19/2020  PCP: Jac Canavanysinger, David S, PA-C  Admit date: 01/19/2020 Discharge date: 01/22/2020  Admitted From: Home Disposition: Home   Recommendations for Outpatient Follow-up:  1. Follow up with PCP in 1-2 weeks. Recommend referral to dietitian for hyperglycemia/prediabetes. 2. Consider bariatric surgery referral. 3. Recommend microcytic anemia work up outside scope of acute illness.  Home Health: None Equipment/Devices: 2L O2 Discharge Condition: Stable CODE STATUS: Full Diet recommendation: Carb-modified  Brief/Interim Summary: Tamara Rowland a39 y.o.femalewith a history of morbid obesity, prediabetes, and covid-19 diagnosed 12/16 who presented with fever, cough, and progressive dyspnea. CX demonstrated peripheral patchy bilateral infiltrates and she was hypoxic on room air. CRP 12.7, PCT negative, hgb 9.8g/dl with microcytic indices. She was admitted and started on remdesivir and steroids.Respiratory effort has normalized and she is able to ambulate well in the room. Hypoxia remains mild. The patient is requesting discharge on Christmas Eve and is felt to be stable to complete course of steroids as an outpatient.   Discharge Diagnoses:  Principal Problem:   COVID-19 virus infection Active Problems:   Prediabetes   Class 3 severe obesity due to excess calories with serious comorbidity and body mass index (BMI) of 60.0 to 69.9 in adult [facility](HCC)   Hypokalemia   Acute hypoxemic respiratory failure due to COVID-19 [facility](HCC)  Acute hypoxemic respiratory failure due to covid-19 pneumonia, possibly also OHS: SARS-CoV-2 PCR positive on12/16, +hypoxia and infiltrates on CXR 12/21.  -Received remdesivir (12/21 - 12/24). CRP responding as expected. - Can taper steroids, will DC on decadron 6mg  po daily for 5 more days (10 total) - Encourage OOB, IS, FV, and awake proning if able - Continue airborne, contact  precautions for 21 days from positive testing.  Prediabetes: HbA1c 5.9%.  - Continue home Tx, not severely hyperglycemic despite high dose steroids as inpatient - Carb-modified diet  OAB:  - Continue myrbetriq  Hypokalemia: Resolved  Microcytic anemia: Chronic microcytosis. No bleeding. Ferritin is 91, unreliable in this clinical setting. Hgb stable. - Further work up as outpatient.   Morbid obesity: Estimated body mass index is 66.13 kg/m  - Consider bariatric surgery discussion +/- referral  Discharge Instructions Discharge Instructions    Diet Carb Modified   Complete by: As directed    Discharge instructions   Complete by: As directed    You are being discharged from the hospital after treatment for covid-19 infection. You are felt to be stable enough to no longer require inpatient monitoring, testing, and treatment, though you will need to follow the recommendations below: - Continue taking decadron 6mg  daily (start tomorrow) for 5 more days.  - You can continue taking vitamin C and zinc daily as well. These have been prescribed in case.  - Per CDC guidelines, you will need to remain in isolation for 21 days from your first positive covid test. - Follow up with your doctor in the next week via telehealth or seek medical attention right away if your symptoms get WORSE.  - You are still encouraged to get a covid vaccination between 21 days (after isolation period ends) and 90 days (before immunity is thought to wear off).  Directions for you at home:  Wear a facemask You should wear a facemask that covers your nose and mouth when you are in the same room with other people and when you visit a healthcare provider. People who live with or visit you should also wear a facemask while they are in  the same room with you.  Separate yourself from other people in your home As much as possible, you should stay in a different room from other people in your home. Also, you should  use a separate bathroom, if available.  Avoid sharing household items You should not share dishes, drinking glasses, cups, eating utensils, towels, bedding, or other items with other people in your home. After using these items, you should wash them thoroughly with soap and water.  Cover your coughs and sneezes Cover your mouth and nose with a tissue when you cough or sneeze, or you can cough or sneeze into your sleeve. Throw used tissues in a lined trash can, and immediately wash your hands with soap and water for at least 20 seconds or use an alcohol-based hand rub.  Wash your [employer] your hands often and thoroughly with soap and water for at least 20 seconds. You can use an alcohol-based hand sanitizer if soap and water are not available and if your hands are not visibly dirty. Avoid touching your eyes, nose, and mouth with unwashed hands.  Directions for those who live with, or provide care at home for you:  Limit the number of people who have contact with the patient If possible, have only one caregiver for the patient. Other household members should stay in another home or place of residence. If this is not possible, they should stay in another room, or be separated from the patient as much as possible. Use a separate bathroom, if available. Restrict visitors who do not have an essential need to be in the home.  Ensure good ventilation Make sure that shared spaces in the home have good air flow, such as from an air conditioner or an opened window, weather permitting.  Wash your hands often Wash your hands often and thoroughly with soap and water for at least 20 seconds. You can use an alcohol based hand sanitizer if soap and water are not available and if your hands are not visibly dirty. Avoid touching your eyes, nose, and mouth with unwashed hands. Use disposable paper towels to dry your hands. If not available, use dedicated cloth towels and replace them when they become  wet.  Wear a facemask and gloves Wear a disposable facemask at all times in the room and gloves when you touch or have contact with the patient's blood, body fluids, and/or secretions or excretions, such as sweat, saliva, sputum, nasal mucus, vomit, urine, or feces.  Ensure the mask fits over your nose and mouth tightly, and do not touch it during use. Throw out disposable facemasks and gloves after using them. Do not reuse. Wash your hands immediately after removing your facemask and gloves. If your personal clothing becomes contaminated, carefully remove clothing and launder. Wash your hands after handling contaminated clothing. Place all used disposable facemasks, gloves, and other waste in a lined container before disposing them with other household waste. Remove gloves and wash your hands immediately after handling these items.  Do not share dishes, glasses, or other household items with the patient Avoid sharing household items. You should not share dishes, drinking glasses, cups, eating utensils, towels, bedding, or other items with a patient who is confirmed to have, or being evaluated for, COVID-19 infection. After the person uses these items, you should wash them thoroughly with soap and water.  Wash laundry thoroughly Immediately remove and wash clothes or bedding that have blood, body fluids, and/or secretions or excretions, such as sweat, saliva, sputum, nasal  mucus, vomit, urine, or feces, on them. Wear gloves when handling laundry from the patient. Read and follow directions on labels of laundry or clothing items and detergent. In general, wash and dry with the warmest temperatures recommended on the label.  Clean all areas the individual has used often Clean all touchable surfaces, such as counters, tabletops, doorknobs, bathroom fixtures, toilets, phones, keyboards, tablets, and bedside tables, every day. Also, clean any surfaces that may have blood, body fluids, and/or  secretions or excretions on them. Wear gloves when cleaning surfaces the patient has come in contact with. Use a diluted bleach solution (e.g., dilute bleach with 1 part bleach and 10 parts water) or a household disinfectant with a label that says EPA-registered for coronaviruses. To make a bleach solution at home, add 1 tablespoon of bleach to 1 quart (4 cups) of water. For a larger supply, add  cup of bleach to 1 gallon (16 cups) of water. Read labels of cleaning products and follow recommendations provided on product labels. Labels contain instructions for safe and effective use of the cleaning product including precautions you should take when applying the product, such as wearing gloves or eye protection and making sure you have good ventilation during use of the product. Remove gloves and wash hands immediately after cleaning.  Monitor yourself for signs and symptoms of illness Caregivers and household members are considered close contacts, should monitor their health, and will be asked to limit movement outside of the home to the extent possible. Follow the monitoring steps for close contacts listed on the symptom monitoring form.  If you have additional questions, contact your local health department or call the epidemiologist on call at [phone] (available 24/7). This guidance is subject to change. For the most up-to-date guidance from [facility], please refer to their website: [email or web address].[email or web address]     Allergies as of 01/22/2020   No Known Allergies     Medication List    STOP taking these medications   fluconazole 150 MG tablet Commonly known as: DIFLUCAN   sulfamethoxazole-trimethoprim 800-160 MG tablet Commonly known as: BACTRIM DS     TAKE these medications   albuterol 108 (90 Base) MCG/ACT inhaler Commonly known as: VENTOLIN HFA Inhale 2 puffs into the lungs every 4 (four) hours as needed for wheezing or shortness of  breath.   ascorbic acid 1000 MG tablet Commonly known as: VITAMIN C Take 1 tablet (1,000 mg total) by mouth daily.   BD Pen Needle Nano U/F 32G X 4 MM Misc Generic drug: Insulin Pen Needle 1 each by Does not apply route at bedtime.   BD Pen Needle Nano U/F 32G X 4 MM Misc Generic drug: Insulin Pen Needle 1 each by Does not apply route at bedtime.   benzonatate 100 MG capsule Commonly known as: TESSALON Take 1 capsule (100 mg total) by mouth every 8 (eight) hours.   dexamethasone 6 MG tablet Commonly known as: Decadron Take 1 tablet (6 mg total) by mouth daily for 5 days. Start taking on: January 23, 2020   mirabegron ER 25 MG Tb24 tablet Commonly known as: Myrbetriq Take 1 tablet (25 mg total) by mouth daily. Overactive bladder   Ozempic (0.25 or 0.5 MG/DOSE) 2 MG/1.5ML Sopn Generic drug: Semaglutide(0.25 or 0.5MG /DOS) Inject 0.5 mg into the skin once a week.   zinc sulfate 220 (50 Zn) MG capsule Take 1 capsule (220 mg total) by mouth daily.            Durable Medical Equipment  (  From admission, onward)         Start     Ordered   01/22/20 0828  For home use only DME oxygen  Once       Question Answer Comment  Length of Need 6 Months   Mode or (Route) Nasal cannula   Liters per Minute 2   Frequency Continuous (stationary and portable oxygen unit needed)   Oxygen delivery system Gas      01/22/20 0830          Follow-up Information    Tysinger, [name], PA-C. Schedule an appointment as soon as possible for a visit in 1 week(s).   Specialty: Family Medicine Contact information: [address] [place] [address] [phone]              No Known Allergies  Consultations:  None  Procedures/Studies: DG Chest 2 View  Result Date: 01/19/2020 CLINICAL DATA:  COVID positive with weakness and shortness of breath. EXAM: CHEST - 2 VIEW COMPARISON:  January 14, 2020 FINDINGS: Mild to moderate severity ill-defined multifocal infiltrates  are seen within the mid and lower lung fields, bilaterally. There is no evidence of a pleural effusion or pneumothorax. The heart size and mediastinal contours are within normal limits. The visualized skeletal structures are unremarkable. IMPRESSION: Mild to moderate severity bilateral multifocal infiltrates. Electronically Signed   By: Aram Candela M.D.   On: 01/19/2020 01:14   DG Chest 2 View  Result Date: 01/14/2020 CLINICAL DATA:  Generalized body aches, chest pain, cough, shortness of breath X 3 weeks. EXAM: CHEST - 2 VIEW COMPARISON:  Chest x-ray 05/08/2017 FINDINGS: The heart size and mediastinal contours are unchanged. Redemonstration of vascular hilar prominence. Low lung volumes. No focal consolidation. No pulmonary edema. No pleural effusion. No pneumothorax. No acute osseous abnormality. IMPRESSION: 1. Low lung volumes with bilateral lower lobe compressive changes. 2. Mild pulmonary congestion. 3. No definite focal consolidation. Electronically Signed   By: Tish Frederickson M.D.   On: 01/14/2020 23:50     Subjective: Feels well, no trouble breathing at rest, very mildly dyspneic after walking around the room several times. Wants to be discharged and would like to have supplemental oxygen. Interested in dietary approaches to moderating blood sugar. No chest pain, leg swelling, or other complaints.   Discharge Exam: Vitals:   01/21/20 2107 01/22/20 0453  BP: 123/72 135/75  Pulse: (!) 59 65  Resp: 19 16  Temp: 98 F (36.7 C) (!) 97.4 F (36.3 C)  SpO2: 98% 100%   General: Pt is alert, awake, not in acute distress Cardiovascular: RRR, S1/S2 +, no rubs, no gallops Respiratory: Nonlabored, significantly improved aeration, now able to pull more on incentive spirometer, no crackles. Abdominal: Soft, NT, ND, bowel sounds + Extremities: No edema, no cyanosis  Labs: BNP (last 3 results) Recent Labs    01/19/20 0427  BNP 23.1   Basic Metabolic Panel: Recent Labs  Lab  01/19/20 0427 01/20/20 0424 01/21/20 0405  NA 138 139 143  K 3.3* 3.8 3.9  CL 101 104 106  CO2 GLUCOSE 105* 182* 151*  BUN CREATININE 0.82 0.59 0.68  CALCIUM 8.1* 8.4* 8.8*   Liver Function Tests: Recent Labs  Lab 01/19/20 0427 01/20/20 0424 01/21/20 0405  AST 14* 15 12*  ALT ALKPHOS 59 62 57  BILITOT 1.0 0.5 0.2*  PROT 7.0 7.7 7.4  ALBUMIN 3.2* 3.2* 3.0*   No results  for input(s): LIPASE, AMYLASE in the last 168 hours. No results for input(s): AMMONIA in the last 168 hours. CBC: Recent Labs  Lab 01/19/20 0427 01/20/20 0424 01/21/20 0405  WBC 9.3 12.6* 19.1*  NEUTROABS 6.6 10.8* 16.8*  HGB 9.8* 10.9* 10.6*  HCT 32.9* 36.2 35.9*  MCV 76.2* 76.1* 76.5*  PLT 231 276 298   Cardiac Enzymes: No results for input(s): CKTOTAL, CKMB, CKMBINDEX, TROPONINI in the last 168 hours. BNP: Invalid input(s): POCBNP CBG: Recent Labs  Lab 01/21/20 0741 01/21/20 1148 01/21/20 1657 01/21/20 2110 01/22/20 0739  GLUCAP 137* 163* 139* 153* 122*   D-Dimer Recent Labs    01/20/20 0424 01/21/20 0405  DDIMER 0.71* 0.29   Hgb A1c Recent Labs    01/19/20 0928  HGBA1C 5.9*   Lipid Profile No results for input(s): CHOL, HDL, LDLCALC, TRIG, CHOLHDL, LDLDIRECT in the last 72 hours. Thyroid function studies No results for input(s): TSH, T4TOTAL, T3FREE, THYROIDAB in the last 72 hours.  Invalid input(s): FREET3 Anemia work up No results for input(s): VITAMINB12, FOLATE, FERRITIN, TIBC, IRON, RETICCTPCT in the last 72 hours. Urinalysis    Component Value Date/Time   LABSPEC 1.020 11/20/2019 1537   PHURINE 7.0 08/27/2017 1800   GLUCOSEU NEGATIVE 08/27/2017 1800   HGBUR SMALL (A) 08/27/2017 1800   BILIRUBINUR small (A) 11/20/2019 1537   BILIRUBINUR n 08/15/2015 1426   KETONESUR negative 11/20/2019 1537   KETONESUR NEGATIVE 08/27/2017 1800   PROTEINUR trace (A) 11/20/2019 1537   PROTEINUR NEGATIVE 08/27/2017 1800   UROBILINOGEN 0.2  08/27/2017 1800   NITRITE Negative 11/20/2019 1537   NITRITE NEGATIVE 08/27/2017 1800   LEUKOCYTESUR Negative 11/20/2019 1537    Microbiology Recent Results (from the past 240 hour(s))  Resp Panel by RT-PCR (Flu A&B, Covid) Nasopharyngeal Swab     Status: Abnormal   Collection Time: 01/14/20 11:16 PM   Specimen: Nasopharyngeal Swab; Nasopharyngeal(NP) swabs in vial transport medium  Result Value Ref Range Status   SARS Coronavirus 2 by RT PCR POSITIVE (A) NEGATIVE Final    Comment: RESULT CALLED TO, READ BACK BY AND VERIFIED WITH: NASH J. 12.17.21 @ 0032 BY MECIAL J. (NOTE) SARS-CoV-2 target nucleic acids are DETECTED.  The SARS-CoV-2 RNA is generally detectable in upper respiratory specimens during the acute phase of infection. Positive results are indicative of the presence of the identified virus, but do not rule out bacterial infection or co-infection with other pathogens not detected by the test. Clinical correlation with patient history and other diagnostic information is necessary to determine patient infection status. The expected result is Negative.  Fact Sheet for Patients: BloggerCourse.com  Fact Sheet for Healthcare Providers: SeriousBroker.it  This test is not yet approved or cleared by the [place] FDA and  has been authorized for detection and/or diagnosis of SARS-CoV-2 by FDA under an Emergency Use Authorization (EUA).  This EUA will remain in effect (meaning this test can  be used) for the duration of  the COVID-19 declaration under Section 564(b)(1) of the Act, 21 U.S.C. section 360bbb-3(b)(1), unless the authorization is terminated or revoked sooner.     Influenza A by PCR NEGATIVE NEGATIVE Final   Influenza B by PCR NEGATIVE NEGATIVE Final    Comment: (NOTE) The Xpert Xpress SARS-CoV-2/FLU/RSV plus assay is intended as an aid in the diagnosis of influenza from Nasopharyngeal swab specimens  and should not be used as a sole basis for treatment. Nasal washings and aspirates are unacceptable for Xpert Xpress SARS-CoV-2/FLU/RSV testing.  Fact Sheet for  Patients: BloggerCourse.com  Fact Sheet for Healthcare Providers: SeriousBroker.it  This test is not yet approved or cleared by the [place] FDA and has been authorized for detection and/or diagnosis of SARS-CoV-2 by FDA under an Emergency Use Authorization (EUA). This EUA will remain in effect (meaning this test can be used) for the duration of the COVID-19 declaration under Section 564(b)(1) of the Act, 21 U.S.C. section 360bbb-3(b)(1), unless the authorization is terminated or revoked.  Performed at [facility], [address], [place], [place] [address]   Blood Culture (routine x 2)     Status: None (Preliminary result)   Collection Time: 01/19/20  4:27 AM   Specimen: BLOOD  Result Value Ref Range Status   Specimen Description   Final    BLOOD RIGHT ANTECUBITAL Performed at [facility], [address], [place], [place] [address]    Special Requests   Final    BOTTLES DRAWN AEROBIC AND ANAEROBIC Blood Culture adequate volume Performed at [facility], [address], [place], [place] [address]    Culture   Final    NO GROWTH 2 DAYS Performed at [facility], [address] [place], [place] [address]    Report Status PENDING  Incomplete  Blood Culture (routine x 2)     Status: None (Preliminary result)   Collection Time: 01/19/20  4:27 AM   Specimen: BLOOD  Result Value Ref Range Status   Specimen Description   Final    BLOOD LEFT ANTECUBITAL Performed at [facility], [address], [place], [place] [address]    Special Requests   Final    BOTTLES DRAWN AEROBIC AND ANAEROBIC Blood Culture adequate volume Performed at [facility], [address] [place], [place] [address]    Culture   Final    NO GROWTH 2 DAYS Performed at [facility], [address] [place], [place] [address]    Report Status PENDING  Incomplete    Time coordinating discharge: Approximately 40 minutes  Tyrone Nine, MD  [facility] 01/22/2020, 8:30 AM

## 2020-01-22 NOTE — Plan of Care (Signed)

## 2020-01-24 LAB — CULTURE, BLOOD (ROUTINE X 2)
Culture: NO GROWTH
Culture: NO GROWTH
Special Requests: ADEQUATE
Special Requests: ADEQUATE

## 2020-01-25 ENCOUNTER — Encounter: Payer: Self-pay | Admitting: Medical

## 2020-01-25 ENCOUNTER — Telehealth: Payer: PRIVATE HEALTH INSURANCE | Admitting: Medical

## 2020-01-25 ENCOUNTER — Other Ambulatory Visit: Payer: Self-pay

## 2020-01-25 VITALS — Ht 62.0 in | Wt 379.0 lb

## 2020-01-25 DIAGNOSIS — R7303 Prediabetes: Secondary | ICD-10-CM | POA: Diagnosis not present

## 2020-01-25 DIAGNOSIS — U071 COVID-19: Secondary | ICD-10-CM | POA: Insufficient documentation

## 2020-01-25 DIAGNOSIS — R519 Headache, unspecified: Secondary | ICD-10-CM

## 2020-01-25 DIAGNOSIS — R0902 Hypoxemia: Secondary | ICD-10-CM

## 2020-01-25 DIAGNOSIS — J1282 Pneumonia due to coronavirus disease 2019: Secondary | ICD-10-CM | POA: Insufficient documentation

## 2020-01-25 DIAGNOSIS — D509 Iron deficiency anemia, unspecified: Secondary | ICD-10-CM

## 2020-01-25 DIAGNOSIS — R232 Flushing: Secondary | ICD-10-CM | POA: Insufficient documentation

## 2020-01-25 MED ORDER — EMERGEN-C IMMUNE PLUS PO PACK: 1.0000 | PACK | Freq: Two times a day (BID) | ORAL | 0 refills | Status: AC

## 2020-01-25 MED ORDER — FERROUS GLUCONATE 324 (38 FE) MG PO TABS: 324.0000 mg | ORAL_TABLET | Freq: Two times a day (BID) | ORAL | 2 refills | Status: DC

## 2020-01-25 NOTE — Patient Instructions (Addendum)
Covid infection General recommendations:  I recommend you rest, hydrate well with water and clear fluids throughout the day.    You can use Tylenol for pain or fever every 4-6 hours  You can use over the counter Delsym or Robitussin DM for cough.  You can use over the counter Emetrol for nausea.     Finish out the oral steroids  Begin EmergenC Immune plus vitamin pack that I sent to your pharmacy  Use the Tessalon Perles cough drops as needed  You can use the Albuterol inhaler 2 puffs every 4-6 hours as needed for cough, wheezing, or shortness of breath  Hydrate with things like Pedialyte, G2 Gatorade no sugar, water, soup broth  Eat fruits, vegetables, soups for now  REST  The tentative plan would be to wean off oxygen later in the week, gradually    Oxygen: continue the daily oxygen the next 2 days, but by Wednesday, try going 1-3 hours at a time without the oxygen and see how you feel.   We can gradually wean off this over the next week depending upon how you feel.   continue to use the oxygen at night for sure for now during the next several days.   Touch base with me by phone or my chart later this week regarding oxygen.   If you are having trouble breathing, if you are very weak, have high fever 103 or higher consistently despite Tylenol, or uncontrollable nausea and vomiting, then call or go to the emergency department.     Anemia  We will refer you to gastroenterology for further evaluation of anemia sometime in January  Begin Ferrous Gluconate iron tablets twice daily to help improve the anemia   Obesity  In a few weeks once you are feeling better, begin the Semaglutide injection 0.25mg  weekly  We will refer you in January to Bariatric Surgery Clinic  Avoid high fat and high sugar foods    Self Quarantine: The CDC, Centers for Disease Control has recommended a self quarantine of 10 days from the start of your illness until you are symptom-free including at  least 24 hours of no symptoms including no fever, no shortness of breath, and no body aches and chills, by day 10 before returning to work or general contact with the public.  What does self quarantine mean: avoiding contact with people as much as possible.   Particularly in your house, isolate your self from others in a separate room, wear a mask when possible in the room, particularly if coughing a lot.   Have others bring food, water, medications, etc., to your door, but avoid direct contact with your household contacts during this time to avoid spreading the infection to them.   If you have a separate bathroom and living quarters during the next 2 weeks away from others, that would be preferable.    If you can't completely isolate, then wear a mask, wash hands frequently with soap and water for at least 15 seconds, minimize close contact with others, and have a friend or family member check regularly from a distance to make sure you are not getting seriously worse.     You should not be going out in public, should not be going to stores, to work or other public places until all your symptoms have resolved and at least 10 days + 24 hours of no symptoms at all have transpired.   Ideally you should avoid contact with others for a full 10 days  if possible.  One of the goals is to limit spread to high risk people; people that are older and elderly, people with multiple health issues like diabetes, heart disease, lung disease, and anybody that has weakened immune systems such as people with cancer or on immunosuppressive therapy.

## 2020-01-25 NOTE — Progress Notes (Signed)
This visit type was conducted due to national recommendations for restrictions regarding the COVID-19 Pandemic (e.g. social distancing) in an effort to limit this patient's exposure and mitigate transmission in our community.  Due to their co-morbid illnesses, this patient is at least at moderate risk for complications without adequate follow up.  This format is felt to be most appropriate for this patient at this time.    Documentation for virtual audio and video telecommunications through [facility] encounter:  The patient was located at home. The provider was located in the office. The patient did consent to this visit and is aware of possible charges through their insurance for this visit.  The other persons participating in this telemedicine service were none. Time spent on call was 19 minutes and in review of previous records >30 minutes total.  This virtual service is not related to other E/M service within previous 7 days. Subjective: Chief Complaint  Patient presents with  . Follow-up    Wants to know what to do about oxygen    Here for hospital follow-up.  Medical team: Gynecology, Dr. Syble Creek, [facility] Tamara Rowland, [name], PA-C   Was hospitalized January 19, 2020 through January 22, 2020.  She started feeling bad on December 16 presenting to the hospital initially with fever, cough, progressive dyspnea.  Chest x-ray revealed peripheral patchy bilateral infiltrates and she was hypoxic on room air.  Hemoglobin was found to be 9.8 g/dL with microcytic indices.  She was admitted for Covid infection, and started on remdesivir and steroids.  She was discharged on December 24 on home oxygen, steroids, albuterol and Tessalon Perles.  Today she notes that she feels some improvement.  She does still have hot flashes, feels weak, is taking lots of naps due to the fatigue.  Feels lightheaded at times.  She has no taste or smell no appetite.  She has been getting some  headaches.  She denies nausea, vomiting, diarrhea, no worsening shortness of breath, no sore throat, no specific fever.  She is trying to hydrate, she has questions about what foods she can eat.  She is still using 2 L of oxygen throughout the day and night.  This is new per this hospitalization.  Non-smoker.  She is using the albuterol probably 3 times per day and that seems to be helping.  She is using the [employer] and plans to finish out the steroid.  She was prescribed some vitamins but the pharmacy says they will not be in for several days.  Regarding anemia, she is not currently taking iron.  She denies any bleeding.  She does not have periods.  No prior colonoscopy.  Regarding obesity, she plans to start the  Semaglutide injection we discussed a few months ago.  She did see bariatric clinic in [place] in the past but at 1 point because she missed her appointment with things just fell off.  She would like to get back into bariatric clinic for consult  No other aggravating or relieving factors. No other complaint.  Past Medical History:  Diagnosis Date  . Anemia 2003   ongoing, has had heavy periods, iron deficiency  . Dysmenorrhea   . Nexplanon in place 01/2012  . Obesity    ROS as in subjective   Objective: Ht 5\' 2"  (1.575 m)   Wt (!) 379 lb (171.9 kg)   BMI 69.32 kg/m   Gen: Seated with nasal cannula in place of oxygen, no obvious wheezing or dyspnea Answers questions appropriately  Assessment: Encounter Diagnoses  Name Primary?  . Pneumonia due to COVID-19 virus Yes  . Hypoxia   . Morbid obesity (HCC)   . Prediabetes   . Hot flashes   . Nonintractable headache, unspecified chronicity pattern, unspecified headache type   . Microcytic anemia      Plan: I reviewed the hospital discharge summary, medicines were reconciled.  We discussed her current symptoms, her recent Covid infection.  I reviewed the hospital imaging reports, labs.  She had a recent  hemoglobin A1c of 5.9%.  She was COVID pneumonia bilateral lungs on imaging.  CRP was elevated.  She is improving slowly.  We discussed the diagnoses above, the follow-up for both obesity and prediabetes as well as anemia  We discussed current treatment and the fact that her recovery can take weeks to get back to normal.  We will do a 1 week follow-up.  I asked her to MyChart message me later this week though regarding weaning down oxygen if she is improving.  We will need to slowly as discussed.  She currently has a back to work note for February 03, 2018.  Patient Instructions  Covid infection General recommendations:  I recommend you rest, hydrate well with water and clear fluids throughout the day.    You can use Tylenol for pain or fever every 4-6 hours  You can use over the counter Delsym or Robitussin DM for cough.  You can use over the counter Emetrol for nausea.     Finish out the oral steroids  Begin [employer] Immune plus vitamin pack that I sent to your pharmacy  Use the Tessalon Perles cough drops as needed  You can use the Albuterol inhaler 2 puffs every 4-6 hours as needed for cough, wheezing, or shortness of breath  Hydrate with things like Pedialyte, G2 Gatorade no sugar, water, soup broth  Eat fruits, vegetables, soups for now  REST  The tentative plan would be to wean off oxygen later in the week, gradually    Oxygen: continue the daily oxygen the next 2 days, but by Wednesday, try going 1-3 hours at a time without the oxygen and see how you feel.   We can gradually wean off this over the next week depending upon how you feel.   continue to use the oxygen at night for sure for now during the next several days.   Touch base with me by phone or my chart later this week regarding oxygen.   If you are having trouble breathing, if you are very weak, have high fever 103 or higher consistently despite Tylenol, or uncontrollable nausea and vomiting, then call or go to  the emergency department.     Anemia  We will refer you to gastroenterology for further evaluation of anemia sometime in January  Begin Ferrous Gluconate iron tablets twice daily to help improve the anemia   Obesity  In a few weeks once you are feeling better, begin the Semaglutide injection 0.25mg  weekly  We will refer you in January to Bariatric Surgery Clinic  Avoid high fat and high sugar foods    Self Quarantine: The CDC, Centers for Disease Control has recommended a self quarantine of 10 days from the start of your illness until you are symptom-free including at least 24 hours of no symptoms including no fever, no shortness of breath, and no body aches and chills, by day 10 before returning to work or general contact with the public.  What does self quarantine mean:  avoiding contact with people as much as possible.   Particularly in your house, isolate your self from others in a separate room, wear a mask when possible in the room, particularly if coughing a lot.   Have others bring food, water, medications, etc., to your door, but avoid direct contact with your household contacts during this time to avoid spreading the infection to them.   If you have a separate bathroom and living quarters during the next 2 weeks away from others, that would be preferable.    If you can't completely isolate, then wear a mask, wash hands frequently with soap and water for at least 15 seconds, minimize close contact with others, and have a friend or family member check regularly from a distance to make sure you are not getting seriously worse.     You should not be going out in public, should not be going to stores, to work or other public places until all your symptoms have resolved and at least 10 days + 24 hours of no symptoms at all have transpired.   Ideally you should avoid contact with others for a full 10 days if possible.  One of the goals is to limit spread to high risk people; people that  are older and elderly, people with multiple health issues like diabetes, heart disease, lung disease, and anybody that has weakened immune systems such as people with cancer or on immunosuppressive therapy.          Nailah was seen today for follow-up.  Diagnoses and all orders for this visit:  Pneumonia due to COVID-19 virus  Hypoxia  Morbid obesity (HCC) -     Amb Referral to Bariatric Surgery  Prediabetes -     Amb Referral to Bariatric Surgery  Hot flashes  Nonintractable headache, unspecified chronicity pattern, unspecified headache type  Microcytic anemia -     Ambulatory referral to Gastroenterology  Other orders -     ferrous gluconate (FERGON) 324 MG tablet; Take 1 tablet (324 mg total) by mouth 2 (two) times daily with a meal. -     Multiple Vitamins-Minerals (EMERGEN-C IMMUNE PLUS) PACK; Take 1 tablet by mouth 2 (two) times daily.  f/u 1wk

## 2020-01-25 NOTE — Progress Notes (Signed)
Done

## 2020-02-08 NOTE — Progress Notes (Signed)
Pt is doing a virtual fu this Friday

## 2020-02-12 ENCOUNTER — Encounter: Payer: Self-pay | Admitting: Medical

## 2020-02-12 ENCOUNTER — Telehealth: Payer: PRIVATE HEALTH INSURANCE | Admitting: Medical

## 2020-02-12 ENCOUNTER — Other Ambulatory Visit: Payer: Self-pay

## 2020-02-12 VITALS — Ht 62.0 in | Wt 356.0 lb

## 2020-02-12 DIAGNOSIS — R635 Abnormal weight gain: Secondary | ICD-10-CM

## 2020-02-12 DIAGNOSIS — R0681 Apnea, not elsewhere classified: Secondary | ICD-10-CM | POA: Diagnosis not present

## 2020-02-12 DIAGNOSIS — N61 Mastitis without abscess: Secondary | ICD-10-CM

## 2020-02-12 DIAGNOSIS — J1282 Pneumonia due to coronavirus disease 2019: Secondary | ICD-10-CM

## 2020-02-12 DIAGNOSIS — U071 COVID-19: Secondary | ICD-10-CM

## 2020-02-12 DIAGNOSIS — R0902 Hypoxemia: Secondary | ICD-10-CM | POA: Diagnosis not present

## 2020-02-12 DIAGNOSIS — R0683 Snoring: Secondary | ICD-10-CM

## 2020-02-12 DIAGNOSIS — R7301 Impaired fasting glucose: Secondary | ICD-10-CM

## 2020-02-12 DIAGNOSIS — R7303 Prediabetes: Secondary | ICD-10-CM

## 2020-02-12 DIAGNOSIS — D509 Iron deficiency anemia, unspecified: Secondary | ICD-10-CM

## 2020-02-12 DIAGNOSIS — D72829 Elevated white blood cell count, unspecified: Secondary | ICD-10-CM

## 2020-02-12 MED ORDER — FLUTICASONE FUROATE-VILANTEROL 100-25 MCG/INH IN AEPB: 1.0000 | INHALATION_SPRAY | Freq: Every day | RESPIRATORY_TRACT | 0 refills | Status: DC

## 2020-02-12 NOTE — Progress Notes (Signed)
This visit type was conducted due to national recommendations for restrictions regarding the COVID-19 Pandemic (e.g. social distancing) in an effort to limit this patient's exposure and mitigate transmission in our community.  Due to their co-morbid illnesses, this patient is at least at moderate risk for complications without adequate follow up.  This format is felt to be most appropriate for this patient at this time.    Documentation for virtual audio and video telecommunications through [facility] encounter:  The patient was located at home. The provider was located in the office. The patient did consent to this visit and is aware of possible charges through their insurance for this visit.  The other persons participating in this telemedicine service were none. Time spent on call was 28 minutes and in review of previous records >35 minutes total.  This virtual service is not related to other E/M service within previous 7 days.     Subjective: Chief Complaint  Patient presents with  . Follow-up    Covid follow up from 01/14/20. Not having any symptoms    Medical team: Gynecology, Dr. Syble Creek, [facility] Tamara Rowland, Tamara Balo, PA-C  Virtual consult follow-up.  Was hospitalized January 19, 2020 through January 22, 2020.  From her hospital follow-up virtual visit we did in December, she is much improved.  She had COVID-pneumonia with hypoxemia and respiratory failure.  She feels about 80% improved.  Her only symptom right now is she gets a little short of breath with stairs but otherwise has much improved.  She has not used oxygen in over a week and plans to turn that back in with home health.  She is still using albuterol maybe twice daily.  She denies any major body aches chills congestion or fever at this point  She has 1 concern as she was listed as diabetic in the hospital although she has been prediabetic.  She did receive insulin for a few days when the  sugars are elevated.  She has not started the Ozempic yet for weight loss and prediabetes as she was scared to get nauseated as she already was having symptoms with her COVID illness.  She is ready to start this  She had to postpone sleep study and bariatric clinic consult due to the hospitalization but is ready to get those underway  Regarding anemia that she is taking her iron.  She denies any bleeding.  She does not have periods.  No prior colonoscopy.  She has some new nipple pain or discomfort in the last few days.  No swelling no redness no knots of the breast.  No injury  No other aggravating or relieving factors. No other complaint.   Past Medical History:  Diagnosis Date  . Anemia 2003   ongoing, has had heavy periods, iron deficiency  . Dysmenorrhea   . Nexplanon in place 01/2012  . Obesity    ROS as in subjective   Objective: Ht 5\' 2"  (1.575 m)   Wt (!) 356 lb (161.5 kg)   BMI 65.11 kg/m   Gen: wd, wn, nad, not ill-appearing Lungs without obvious shortness of breath or wheezing Answers questions in complete sentences  Otherwise not examined in person as this was a virtual consult   Assessment: Encounter Diagnoses  Name Primary?  . Pneumonia due to COVID-19 virus Yes  . Hypoxia   . Impaired fasting blood sugar   . Witnessed apneic spells   . Weight gain   . Snoring   . Prediabetes   .  Morbid obesity (HCC)   . Iron deficiency anemia, unspecified iron deficiency anemia type   . Nipple inflammation   . Leukocytosis, unspecified type      Plan: I reviewed her visit notes from the hospital follow-up virtual visit on 01/25/2020  COVID infection, COVID-pneumonia with hypoxemia- she feels like she is 80% improved.  She has residual SOB mainly with stairs.  She will begin trial of Breo inhaler, continue albuterol as needed.  Discussed risk benefits and proper use of both medicines.  Continue to hydrate and gradual return to activity.  She will come in for labs  within the next 2 weeks  Nipple tenderness-likely due to COVID.  She denies any signs or symptoms of infection or lump.  We discussed the many oddities about COVID infection that can include breast tenderness.  Advised cool compresses or moisturizing lotion.  She will come in for in person exam over the next week or 2 if this continues  She had become hypoxic with a hospitalization and COVID infection but that has resolved.  She has not used oxygen in over a week.  She will call home health to pick up the oxygen supplies  Prediabetes- we discussed that she is not diabetic but prediabetic.  We discussed that with her recent illness her blood sugars were fluctuating which is not uncommon.  She will go ahead and begin Ozempic now to help with sugar control and prediabetes and to help with weight loss.  Discussed risk and benefits and proper use of medication  Obesity, prediabetes, other comorbid conditions-referral back to bariatric clinic for weight loss consult.  This had to be postponed while she was hospitalized  Snoring, obesity, daytime somnolence- due to hospitalization her sleep study was put on hold.  We will go ahead now and refer for sleep study home sleep study    This virtual visit began with video but was interrupted and remainder had to be done by phone.  Less than 50% of time was completed on the video.  Tamara Rowland was seen today for follow-up.  Diagnoses and all orders for this visit:  Pneumonia due to COVID-19 virus -     CBC with Differential/Platelet; Future  Hypoxia  Impaired fasting blood sugar -     Basic metabolic panel; Future  Witnessed apneic spells  Weight gain  Snoring  Prediabetes -     Basic metabolic panel; Future  Morbid obesity (HCC)  Iron deficiency anemia, unspecified iron deficiency anemia type -     CBC with Differential/Platelet; Future -     Iron; Future -     Hemoccult - 1 Card (office); Future  Nipple inflammation -     CBC with  Differential/Platelet; Future  Leukocytosis, unspecified type -     CBC with Differential/Platelet; Future  Other orders -     fluticasone furoate-vilanterol (BREO ELLIPTA) 100-25 MCG/INH AEPB; Inhale 1 puff into the lungs daily.  f/u 1-2 weeks for labs, pending referrals

## 2020-02-12 NOTE — Progress Notes (Signed)
Patient has been informed that [employer] actually sent out equipment and she will need to return the equipment for results. Patient also provided information to call and schedule appointment with [facility] surgery as referral that was placed is still good.

## 2020-03-03 ENCOUNTER — Telehealth: Payer: Self-pay | Admitting: Medical

## 2020-03-03 NOTE — Telephone Encounter (Signed)
Pt emailed me a form from [facility] that needs to be completed by you and faxed to [phone]. Form placed in Genera's folder

## 2020-03-03 NOTE — Telephone Encounter (Signed)
Get me the forms please

## 2020-03-04 ENCOUNTER — Encounter: Payer: Self-pay | Admitting: Medical

## 2020-03-04 NOTE — Telephone Encounter (Signed)
Form placed on your desk, please complete.

## 2020-03-04 NOTE — Telephone Encounter (Signed)
Done

## 2020-03-04 NOTE — Telephone Encounter (Signed)
Add the letter I printed

## 2020-03-08 ENCOUNTER — Ambulatory Visit: Payer: PRIVATE HEALTH INSURANCE | Admitting: Gastroenterology

## 2020-05-19 ENCOUNTER — Telehealth: Payer: Self-pay

## 2020-05-19 NOTE — Telephone Encounter (Signed)
P.A. BREO ELLIPTA completed and denied.  Pt needs trial and failure of 2 covered alternatives Symbicort, Dulera or Advair.  Can pt be switched to covered alternative?

## 2020-05-19 NOTE — Telephone Encounter (Signed)
yours

## 2020-05-21 ENCOUNTER — Other Ambulatory Visit: Payer: Self-pay | Admitting: Medical

## 2020-05-21 MED ORDER — FLUTICASONE-SALMETEROL 250-50 MCG/DOSE IN AEPB: 1.0000 | INHALATION_SPRAY | Freq: Two times a day (BID) | RESPIRATORY_TRACT | 0 refills | Status: AC

## 2020-05-21 NOTE — Telephone Encounter (Signed)
I sent Advair as alternate to Breo.    Get back in for recheck on breathing, plan for spirometry next visit to check lung volumes

## 2020-05-24 NOTE — Telephone Encounter (Signed)
Called pt and informed and she will call back and schedule appt.  Also called pharmacy and they didn't have Rx for Advair, so I called in.

## 2020-08-04 ENCOUNTER — Emergency Department (HOSPITAL_COMMUNITY)
Admission: EM | Admit: 2020-08-04 | Discharge: 2020-08-04 | Disposition: A | Discharge status: Home / Self Care | Payer: Medicaid Other | Attending: Emergency Medicine | Admitting: Emergency Medicine

## 2020-08-04 ENCOUNTER — Emergency Department (HOSPITAL_COMMUNITY): Payer: Medicaid Other

## 2020-08-04 ENCOUNTER — Other Ambulatory Visit: Payer: Self-pay

## 2020-08-04 DIAGNOSIS — Z3202 Encounter for pregnancy test, result negative: Secondary | ICD-10-CM | POA: Insufficient documentation

## 2020-08-04 DIAGNOSIS — Z8616 Personal history of COVID-19: Secondary | ICD-10-CM | POA: Diagnosis not present

## 2020-08-04 DIAGNOSIS — M546 Pain in thoracic spine: Secondary | ICD-10-CM

## 2020-08-04 DIAGNOSIS — R0789 Other chest pain: Secondary | ICD-10-CM | POA: Insufficient documentation

## 2020-08-04 DIAGNOSIS — R0602 Shortness of breath: Secondary | ICD-10-CM

## 2020-08-04 DIAGNOSIS — R079 Chest pain, unspecified: Secondary | ICD-10-CM

## 2020-08-04 DIAGNOSIS — R0609 Other forms of dyspnea: Secondary | ICD-10-CM | POA: Diagnosis present

## 2020-08-04 DIAGNOSIS — M549 Dorsalgia, unspecified: Secondary | ICD-10-CM | POA: Diagnosis not present

## 2020-08-04 LAB — BASIC METABOLIC PANEL
Anion gap: 6 (ref 5–15)
BUN: 13 mg/dL (ref 6–20)
CO2: 28 mmol/L (ref 22–32)
Calcium: 8.7 mg/dL — ABNORMAL LOW (ref 8.9–10.3)
Chloride: 106 mmol/L (ref 98–111)
Creatinine, Ser: 0.71 mg/dL (ref 0.44–1.00)
GFR, Estimated: >60 mL/min (ref >60)
Glucose, Bld: 110 mg/dL — ABNORMAL HIGH (ref 70–99)
Potassium: 3.6 mmol/L (ref 3.5–5.1)
Sodium: 140 mmol/L (ref 135–145)

## 2020-08-04 LAB — TROPONIN I (HIGH SENSITIVITY): Troponin I (High Sensitivity): <2 ng/L (ref <18)

## 2020-08-04 LAB — URINALYSIS, ROUTINE W REFLEX MICROSCOPIC
Bilirubin Urine: NEGATIVE
Glucose, UA: NEGATIVE mg/dL
Hgb urine dipstick: NEGATIVE
Ketones, ur: NEGATIVE mg/dL
Nitrite: NEGATIVE
Protein, ur: NEGATIVE mg/dL
Specific Gravity, Urine: 1.019 (ref 1.005–1.030)
pH: 6 (ref 5.0–8.0)

## 2020-08-04 LAB — CBC WITH DIFFERENTIAL/PLATELET
Abs Immature Granulocytes: 0.04 10*3/uL (ref 0.00–0.07)
Basophils Absolute: 0 10*3/uL (ref 0.0–0.1)
Basophils Relative: 0 %
Eosinophils Absolute: 0.1 10*3/uL (ref 0.0–0.5)
Eosinophils Relative: 2 %
HCT: 36.7 % (ref 36.0–46.0)
Hemoglobin: 11.1 g/dL — ABNORMAL LOW (ref 12.0–15.0)
Immature Granulocytes: 1 %
Lymphocytes Relative: 29 %
Lymphs Abs: 2.6 10*3/uL (ref 0.7–4.0)
MCH: 23.1 pg — ABNORMAL LOW (ref 26.0–34.0)
MCHC: 30.2 g/dL (ref 30.0–36.0)
MCV: 76.3 fL — ABNORMAL LOW (ref 80.0–100.0)
Monocytes Absolute: 0.5 10*3/uL (ref 0.1–1.0)
Monocytes Relative: 6 %
Neutro Abs: 5.5 10*3/uL (ref 1.7–7.7)
Neutrophils Relative %: 62 %
Platelets: 286 10*3/uL (ref 150–400)
RBC: 4.81 MIL/uL (ref 3.87–5.11)
RDW: 18.3 % — ABNORMAL HIGH (ref 11.5–15.5)
WBC: 8.8 10*3/uL (ref 4.0–10.5)
nRBC: 0 % (ref 0.0–0.2)

## 2020-08-04 LAB — I-STAT BETA HCG BLOOD, ED (MC, WL, AP ONLY): I-stat hCG, quantitative: <5 m[IU]/mL (ref <5)

## 2020-08-04 MED ORDER — IBUPROFEN 800 MG PO TABS: 800.0000 mg | ORAL_TABLET | Freq: Three times a day (TID) | ORAL | 0 refills | Status: DC

## 2020-08-04 MED ORDER — SODIUM CHLORIDE (PF) 0.9 % IJ SOLN
INTRAMUSCULAR | Status: AC
  Filled 2020-08-04: qty 50

## 2020-08-04 MED ORDER — IOHEXOL 350 MG/ML SOLN
100.0000 mL | Freq: Once | INTRAVENOUS | Status: AC | PRN
  Administered 2020-08-04: 100 mL via INTRAVENOUS

## 2020-08-04 NOTE — ED Provider Notes (Signed)
[facility]-EMERGENCY DEPT Provider Note   CSN: [id number] Arrival date & time: 08/04/20  0744     History Chief Complaint  Patient presents with   Back Pain    Tamara Rowland is a 40 y.o. female who presents to the ED today with complaint of gradual onset, constant, right sided chest pain and right upper back pain since being diagnosed with COVID 19 in December 2021. Pt reports she was in the hospital for about 1 week and discharged on oxygen for about 3-4 weeks. She states that since that time she had had pleuritic type pain in her chest as well as dyspnea on exertion. She does admit to ignoring her symptoms as she does not like coming to the hospital however this past week her symptoms have worsened causing concern. She was given an inhaler during her hospitalizations and has felt like she has needed to use it more and more recently due to worsening SOB. Last night the pain in her right upper back became so severe prompting ED visit today. Pt denies any hx of DVT/PE. She is not taking OCPs or other exogenous hormones. No hx of active malignancy. No hemoptysis. No other complaints at this time.   The history is provided by the patient and medical records.      Past Medical History:  Diagnosis Date   Anemia 2003   ongoing, has had heavy periods, iron deficiency   Dysmenorrhea    Nexplanon in place 01/2012   Obesity     Patient Active Problem List   Diagnosis Date Noted   Nipple inflammation 02/12/2020   Leukocytosis 02/12/2020   Pneumonia due to COVID-19 virus 01/25/2020   Hypoxia 01/25/2020   Nonintractable headache 01/25/2020   Hot flashes 01/25/2020   Microcytic anemia 01/25/2020   COVID-19 virus infection 01/19/2020   Hypokalemia 01/19/2020   Acute hypoxemic respiratory failure due to COVID-19 (HCC) 01/19/2020   Polydipsia 11/20/2019   BMI 60.0-69.9, adult (HCC) 11/20/2019   DOE (dyspnea on exertion) 08/12/2019   Edema of both lower extremities  08/12/2019   Impaired fasting blood sugar 08/12/2019   Vitamin D deficiency 07/20/2019   Folic acid deficiency 07/20/2019   Witnessed apneic spells 07/20/2019   Daytime somnolence 07/20/2019   Snoring 07/20/2019   Abnormal urine odor 07/20/2019   Dysuria 07/20/2019   Weight gain 07/20/2019   Class 3 severe obesity due to excess calories with serious comorbidity and body mass index (BMI) of 60.0 to 69.9 in adult (HCC) 07/20/2019   Iron deficiency 07/20/2019   Prediabetes 10/27/2018   Upper back pain, chronic 07/16/2018   Breast hypertrophy in female 07/16/2018   Dysmenorrhea 08/15/2015   Iron deficiency anemia 08/15/2015   Nexplanon in place 08/15/2015   Morbid obesity (HCC) 08/15/2015   Routine general medical examination at a health care facility 08/15/2015   Screening for condition 08/15/2015   Urinary frequency 08/15/2015   Family history of thyroid disease 08/15/2015   Vaginal discharge 08/15/2015    Past Surgical History:  Procedure Laterality Date   DILATION AND CURETTAGE OF UTERUS  2013   INSERTION OF CONTRACEPTIVE CAPSULE  01/2012   nexplanon     OB History   No obstetric history on file.     Family History  Problem Relation Age of Onset   Hypertension Mother    Obesity Mother    Diabetes Maternal Aunt    Cancer Maternal Grandmother        stomach   Diabetes Maternal Grandmother  Hypertension Maternal Grandmother    Cancer Other        stomach, cervical, bone, prostate, distant relatives   Diabetes Maternal Aunt    Stroke Neg Hx    Heart disease Neg Hx     Social History   Tobacco Use   Smoking status: Never   Smokeless tobacco: Never  Vaping Use   Vaping Use: Never used  Substance Use Topics   Alcohol use: No    Alcohol/week: 0.0 standard drinks   Drug use: No    Home Medications Prior to Admission medications   Medication Sig Start Date End Date Taking? Authorizing Provider  acetaminophen (TYLENOL) 325 MG tablet Take 650 mg by mouth  every 6 (six) hours as needed for mild pain, fever or headache.   Yes [provider]  albuterol (VENTOLIN HFA) 108 (90 Base) MCG/ACT inhaler Inhale 2 puffs into the lungs every 4 (four) hours as needed for wheezing or shortness of breath. 01/15/20  Yes Tilden Fossaees, Elizabeth, MD  ibuprofen (ADVIL) 800 MG tablet Take 1 tablet (800 mg total) by mouth 3 (three) times daily. 08/04/20  Yes Delinda Malan, PA-C  ascorbic acid (VITAMIN C) 1000 MG tablet Take 1 tablet (1,000 mg total) by mouth daily. Patient not taking: Reported on 08/04/2020 01/22/20   Tyrone NineGrunz, Ryan B, MD  ferrous gluconate (FERGON) 324 MG tablet Take 1 tablet (324 mg total) by mouth 2 (two) times daily with a meal. Patient not taking: Reported on 08/04/2020 01/25/20   Tysinger, Kermit Baloavid S, PA-C  Fluticasone-Salmeterol (ADVAIR DISKUS) 250-50 MCG/DOSE AEPB Inhale 1 puff into the lungs in the morning and at bedtime. Patient not taking: Reported on 08/04/2020 05/21/20 05/21/21  Tysinger, Kermit Baloavid S, PA-C  Insulin Pen Needle (BD PEN NEEDLE NANO U/F) 32G X 4 MM MISC 1 each by Does not apply route at bedtime. 08/13/19   Tysinger, Kermit Baloavid S, PA-C  Insulin Pen Needle (BD PEN NEEDLE NANO U/F) 32G X 4 MM MISC 1 each by Does not apply route at bedtime. 11/20/19   Tysinger, Kermit Baloavid S, PA-C  Multiple Vitamins-Minerals (EMERGEN-C IMMUNE PLUS) PACK Take 1 tablet by mouth 2 (two) times daily. Patient not taking: Reported on 08/04/2020 01/25/20   Tysinger, Kermit Baloavid S, PA-C  Semaglutide,0.25 or 0.5MG /DOS, (OZEMPIC, 0.25 OR 0.5 MG/DOSE,) 2 MG/1.5ML SOPN Inject 0.5 mg into the skin once a week. Patient not taking: No sig reported 11/20/19   Tysinger, Kermit Baloavid S, PA-C  zinc sulfate 220 (50 Zn) MG capsule Take 1 capsule (220 mg total) by mouth daily. Patient not taking: No sig reported 01/22/20   Tyrone NineGrunz, Ryan B, MD    Allergies    Patient has no known allergies.  Review of Systems   Review of Systems  Constitutional:  Negative for chills and fever.  Respiratory:  Positive for  shortness of breath.   Cardiovascular:  Positive for chest pain.  Musculoskeletal:  Positive for back pain.  All other systems reviewed and are negative.  Physical Exam Updated Vital Signs BP (!) 146/89   Pulse 68   Temp 98.6 F (37 C) (Oral)   Resp 18   Ht 5\' 2"  (1.575 m)   Wt (!) 163.3 kg   SpO2 96%   BMI 65.84 kg/m   Physical Exam Vitals and nursing note reviewed.  Constitutional:      Appearance: She is obese. She is not ill-appearing or diaphoretic.  HENT:     Head: Normocephalic and atraumatic.  Eyes:     Conjunctiva/sclera: Conjunctivae normal.  Cardiovascular:     Rate and Rhythm: Normal rate and regular rhythm.     Pulses: Normal pulses.  Pulmonary:     Effort: Pulmonary effort is normal.     Breath sounds: Normal breath sounds. No wheezing, rhonchi or rales.     Comments: + diffuse right sided chest wall TTP Chest:     Chest wall: Tenderness present.  Abdominal:     Palpations: Abdomen is soft.     Tenderness: There is no abdominal tenderness. There is no guarding or rebound.  Musculoskeletal:     Cervical back: Neck supple.     Comments: + right upper back TTP. No midline spinal TTP. Moving all extremities without difficulty.   Skin:    General: Skin is warm and dry.  Neurological:     Mental Status: She is alert.    ED Results / Procedures / Treatments   Labs (all labs ordered are listed, but only abnormal results are displayed) Labs Reviewed  BASIC METABOLIC PANEL - Abnormal; Notable for the following components:      Result Value   Glucose, Bld 110 (*)    Calcium 8.7 (*)    All other components within normal limits  CBC WITH DIFFERENTIAL/PLATELET - Abnormal; Notable for the following components:   Hemoglobin 11.1 (*)    MCV 76.3 (*)    MCH 23.1 (*)    RDW 18.3 (*)    All other components within normal limits  URINALYSIS, ROUTINE W REFLEX MICROSCOPIC - Abnormal; Notable for the following components:   Leukocytes,Ua TRACE (*)    Bacteria, UA  RARE (*)    All other components within normal limits  I-STAT BETA HCG BLOOD, ED ([facility], [facility], [facility] ONLY)  TROPONIN I (HIGH SENSITIVITY)    EKG None  Radiology DG Chest 2 View  Result Date: 08/04/2020 CLINICAL DATA:  Chest pain, shortness of breath EXAM: CHEST - 2 VIEW COMPARISON:  01/19/2020 FINDINGS: Stable cardiomediastinal contours with top-normal heart size. Both lungs are clear. No pleural effusion or pneumothorax. The visualized skeletal structures are unremarkable. IMPRESSION: No acute process in the chest. Electronically Signed   By: Guadlupe Spanish M.D.   On: 08/04/2020 09:32   CT Angio Chest PE W/Cm &/Or Wo Cm  Result Date: 08/04/2020 CLINICAL DATA:  Mid back and chest pain with inspiration, fatigue, high prob PE study EXAM: CT ANGIOGRAPHY CHEST WITH CONTRAST TECHNIQUE: Multidetector CT imaging of the chest was performed using the standard protocol during bolus administration of intravenous contrast. Multiplanar CT image reconstructions and MIPs were obtained to evaluate the vascular anatomy. CONTRAST:  OMNIPAQUE IOHEXOL 350 MG/ML SOLN Patient was scanned with her arm by her side due to tenuous ultrasound-guided IV placement. COMPARISON:  None. FINDINGS: Cardiovascular: Heart size normal. No pericardial effusion. Limited contrast opacification of the pulmonary arterial tree. Additionally, significant streak artifact degrades evaluation of central and segmental pulmonary arteries. Adequate contrast opacification of the thoracic aorta with no evidence of dissection, aneurysm, or stenosis. There is classic 3-vessel brachiocephalic arch anatomy without proximal stenosis. Mediastinum/Nodes: No mass or adenopathy. Lungs/Pleura: No pleural effusion. No pneumothorax. Lungs are clear. Upper Abdomen: No acute findings. Musculoskeletal: Obesity.  No fracture or worrisome bone lesion. Review of the MIP images confirms the above findings. IMPRESSION: 1. The study is nondiagnostic for evaluation of  pulmonary emboli. If clinical concern persists, consider perfusion scintigraphy for further evaluation. Given body habitus and IV access issues, reinjection and rescanning the CTA is less likely to provide additional diagnostic  information. I reviewed the case with Dr. Loreta Ave, who concurs. 2. No acute findings. Electronically Signed   By: Corlis Leak M.D.   On: 08/04/2020 11:19    Procedures Procedures   Medications Ordered in ED Medications  iohexol (OMNIPAQUE) 350 MG/ML injection 100 mL (100 mLs Intravenous Contrast Given 08/04/20 1033)  sodium chloride (PF) 0.9 % injection (  Given 08/04/20 1104)    ED Course  I have reviewed the triage vital signs and the nursing notes.  Pertinent labs & imaging results that were available during my care of the patient were reviewed by me and considered in my medical decision making (see chart for details).    MDM Rules/Calculators/A&P                          40 year old female presents to the ED today with complaint of consistent right-sided chest wall and right upper back pain for the past several months secondary to having COVID-19 requiring hospitalization in December 2021.  Pain worsened this past week prompting ED visit today.  On arrival to the ED today patient is afebrile, nontachycardic and nontachypneic.  She is satting 96% on room air.  Able to speak in full sentences without difficulty.  Patient does have a history of obesity.  No other risk factors for PE at this time however in the differential today given pleuritic type pain secondary to COVID infection. However would suspect worsening condition sooner than 6-7 months post COVID. Will plan for EKG, CXR, Labs including CBC, BMP, troponin with plans for CTA.   EKG with NSR. No acute ischemic changes CXR clear CBC without leukocytosis. Hgb 11.1 which appears baseline for patient.  BMP with glucose 110. Calcium 8.7 which again appears chronic for patient.  Troponin < 2 Beta hcg negative U/A  with trace leuks and rare bacteria however 0-5 Wbcs per HPF. No urinary symptoms.   CTA: IMPRESSION:  1. The study is nondiagnostic for evaluation of pulmonary emboli.  If clinical concern persists, consider perfusion scintigraphy for  further evaluation. Given body habitus and IV access issues,  reinjection and rescanning the CTA is less likely to provide  additional diagnostic information.  I reviewed the case with Dr. Loreta Ave, who concurs.  2. No acute findings.      Given symptoms have been ongoing since December I do not feel pt requires emergent VQ scan at this time. She is resting comfortably in the ED;  nontachycardic and nonhypoxic. Will have her follow up in the outpatient setting with PCP for further eval. Discussed case with attending physician Dr. Estell Harpin who agrees with plan. She is in agreement with plan and stable for discharge home.   This note was prepared using Dragon voice recognition software and may include unintentional dictation errors due to the inherent limitations of voice recognition software.   Final Clinical Impression(s) / ED Diagnoses Final diagnoses:  Nonspecific chest pain  Right-sided thoracic back pain, unspecified chronicity  Shortness of breath    Rx / DC Orders ED Discharge Orders          Ordered    ibuprofen (ADVIL) 800 MG tablet  3 times daily        08/04/20 1207             Discharge Instructions      Your workup was overall reassuring in the ED today. The CT scan was limited at this time. Please follow up with your PCP in  the outpatient setting for further evaluation of your chest/back pain and shortness of breath. It is recommended that if your symptoms persist to have what's called a perfusion scintigraphy for further evaluation. Your PCP can order this in the outpatient setting.   I would recommend taking 800 mg Ibuprofen every 8 hours as needed for pain. Please pick up the prescription at your pharmacy and take as directed.    Return to the ED for any new/worsening symptoms       Tanda Rockers, Cordelia Poche 08/04/20 1210    Bethann Berkshire, MD 08/07/20 [phone]

## 2020-08-04 NOTE — Discharge Instructions (Addendum)
Your workup was overall reassuring in the ED today. The CT scan was limited at this time. Please follow up with your PCP in the outpatient setting for further evaluation of your chest/back pain and shortness of breath. It is recommended that if your symptoms persist to have what's called a perfusion scintigraphy for further evaluation. Your PCP can order this in the outpatient setting.   I would recommend taking 800 mg Ibuprofen every 8 hours as needed for pain. Please pick up the prescription at your pharmacy and take as directed.   Return to the ED for any new/worsening symptoms

## 2020-08-04 NOTE — ED Notes (Signed)
Patient has a urine culture in the main lab 

## 2020-08-04 NOTE — ED Triage Notes (Signed)
Pt arrives to ED c/o pain in the upper right to mid back and chest primarily with inspiration since covid/pna in Dec. Last night felt like someone pressing on back. Noticed more fatigue lately. No hx lung issues or blood clots. Been using inhaler.

## 2020-08-09 ENCOUNTER — Inpatient Hospital Stay: Payer: PRIVATE HEALTH INSURANCE | Admitting: Medical

## 2020-08-09 ENCOUNTER — Other Ambulatory Visit: Payer: Self-pay

## 2020-08-09 NOTE — Progress Notes (Deleted)
Chest and back pain ED 08/04/20  Asthma, advair Iron defi Bmi Ozemipic  Chronic low calcium? Anemia eval?  Iron defi?  Colon?  Gall bladder?  Breathing ok?  Injury, fall?   Calcium - vit d, pth,  pancreatitis, circuoral paresthesia, Magneiusm defi  Mg Vit D PTH Iron malnutrition   Past Surgical History:  Procedure Laterality Date   DILATION AND CURETTAGE OF UTERUS  2013   INSERTION OF CONTRACEPTIVE CAPSULE  01/2012   nexplanon   Wt Readings from Last 3 Encounters:  08/04/20 (!) 360 lb (163.3 kg)  02/12/20 (!) 356 lb (161.5 kg)  01/25/20 (!) 379 lb (171.9 kg)

## 2020-08-17 ENCOUNTER — Encounter (HOSPITAL_COMMUNITY): Payer: Self-pay | Admitting: Emergency Medicine

## 2020-08-17 ENCOUNTER — Emergency Department (HOSPITAL_COMMUNITY)
Admission: EM | Admit: 2020-08-17 | Discharge: 2020-08-17 | Disposition: A | Discharge status: Home / Self Care | Payer: Medicaid Other | Attending: Emergency Medicine | Admitting: Emergency Medicine

## 2020-08-17 ENCOUNTER — Other Ambulatory Visit: Payer: Self-pay

## 2020-08-17 DIAGNOSIS — J029 Acute pharyngitis, unspecified: Secondary | ICD-10-CM | POA: Diagnosis not present

## 2020-08-17 DIAGNOSIS — Z5321 Procedure and treatment not carried out due to patient leaving prior to being seen by health care provider: Secondary | ICD-10-CM | POA: Diagnosis not present

## 2020-08-17 DIAGNOSIS — R5381 Other malaise: Secondary | ICD-10-CM | POA: Insufficient documentation

## 2020-08-17 DIAGNOSIS — R059 Cough, unspecified: Secondary | ICD-10-CM | POA: Diagnosis present

## 2020-08-17 NOTE — ED Notes (Signed)
Called to triage 3 X's, no answer

## 2020-08-17 NOTE — ED Triage Notes (Signed)
Patient c/o sore throat, cough, and malaise since yesterday. States symptoms mimic previous Covid diagnosis.

## 2020-08-18 ENCOUNTER — Other Ambulatory Visit: Payer: Self-pay

## 2020-08-18 ENCOUNTER — Encounter (HOSPITAL_COMMUNITY): Payer: Self-pay | Admitting: Emergency Medicine

## 2020-08-18 ENCOUNTER — Emergency Department (HOSPITAL_COMMUNITY)
Admission: EM | Admit: 2020-08-18 | Discharge: 2020-08-19 | Disposition: A | Discharge status: Home / Self Care | Payer: Medicaid Other | Attending: Emergency Medicine | Admitting: Emergency Medicine

## 2020-08-18 DIAGNOSIS — Z5321 Procedure and treatment not carried out due to patient leaving prior to being seen by health care provider: Secondary | ICD-10-CM | POA: Insufficient documentation

## 2020-08-18 DIAGNOSIS — K0889 Other specified disorders of teeth and supporting structures: Secondary | ICD-10-CM | POA: Diagnosis present

## 2020-08-18 NOTE — ED Triage Notes (Signed)
Patient c/o left lower dental pain x1 month worsening today. States she has not been able to see dentist.

## 2020-08-19 NOTE — ED Notes (Signed)
Pt eloped from waiting area. Called 3x.  

## 2020-11-14 LAB — HM PAP SMEAR: HM Pap smear: NEGATIVE

## 2020-11-14 LAB — RESULTS CONSOLE HPV: CHL HPV: NEGATIVE

## 2020-11-19 ENCOUNTER — Telehealth: Payer: Self-pay

## 2020-11-19 NOTE — Telephone Encounter (Signed)
P.A. OZEMPIC RENEWAL  

## 2020-11-29 ENCOUNTER — Encounter: Payer: Self-pay | Admitting: Internal Medicine

## 2020-12-05 NOTE — Telephone Encounter (Signed)
P.A. approved til 11/19/2021, called pt and informed, pt has no active Rx.  Pt would like refill.  I advised her that she needed an appt.  She was driving & she will call back tomorrow when she is in front of her schedule.

## 2021-07-18 ENCOUNTER — Encounter (HOSPITAL_COMMUNITY): Payer: Self-pay

## 2021-07-18 ENCOUNTER — Ambulatory Visit (HOSPITAL_COMMUNITY)
Admission: EM | Admit: 2021-07-18 | Discharge: 2021-07-18 | Disposition: A | Discharge status: Home / Self Care | Payer: Medicaid Other

## 2021-07-18 DIAGNOSIS — K529 Noninfective gastroenteritis and colitis, unspecified: Secondary | ICD-10-CM | POA: Diagnosis not present

## 2021-07-18 DIAGNOSIS — R112 Nausea with vomiting, unspecified: Secondary | ICD-10-CM

## 2021-07-18 MED ORDER — ONDANSETRON 4 MG PO TBDP: 4.0000 mg | ORAL_TABLET | Freq: Three times a day (TID) | ORAL | 0 refills | Status: DC | PRN

## 2021-07-18 MED ORDER — ONDANSETRON 4 MG PO TBDP
4.0000 mg | ORAL_TABLET | Freq: Once | ORAL | Status: AC
  Administered 2021-07-18: 4 mg via ORAL

## 2021-07-18 MED ORDER — ONDANSETRON 4 MG PO TBDP
ORAL_TABLET | ORAL | Status: AC
  Filled 2021-07-18: qty 1

## 2021-07-18 NOTE — Discharge Instructions (Addendum)
You have viral gastroenteritis. Take Zofran 4 mg under the tongue every 8 hours as needed.  You may have more of this medication tonight at 1 AM since you were given some in the clinic. Drink plenty of water and eat a bland diet including bananas, toast, rice, and applesauce for the next 12 to 24 hours.  Avoid spicy foods while you are recovering from this illness.  You may increase your diet as tolerated.  Work note is at the end of the packet.  If you do not improve in the next 2 to 3 days with the medicine or if you continue to have diarrhea, you need to return to urgent care for further evaluation.  If you develop any new or worsening symptoms or do not improve in the next 2 to 3 days, please return.  If your symptoms are severe, please go to the emergency room.  Follow-up with your primary care provider for further evaluation and management of your symptoms as well as ongoing wellness visits.  I hope you feel better!

## 2021-07-18 NOTE — ED Triage Notes (Signed)
Onset of vomiting and diarrhea this morning. Unable to keep any food down, have drank some sprite. Patient states there is an awful smell and taste when she burps.   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 No dietary or medication changes.

## 2021-07-18 NOTE — ED Provider Notes (Signed)
[place]    CSN: [id number] Arrival date & time: 07/18/21  1529      History   Chief Complaint Chief Complaint  Patient presents with   Emesis   Diarrhea    HPI Tamara Rowland is a 41 y.o. female.   Patient presents to urgent care for evaluation of nausea, vomiting, and diarrhea starting this morning.  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  States that she used the bathroom this morning and was unable to stop vomiting and having episodes of diarrhea for short period of time.  Last time vomiting was over 2 hours ago.  She has been able to keep down Sprite without vomiting the last few hours.  She has not eaten anything today, but denies feeling confused, shaky, or dizzy.  She states that she feels tired after all of the emesis and diarrhea.  She ate [place] food last night, but did not order anything that is out of the ordinary for her.  She ate steak and try with rice and vegetables and states that the meat was fully cooked to her knowledge.  She has never had this type of reaction after eating [place] food in the past.  She denies blood in the vomit and numbness.  No urinary symptoms, chest pain, shortness of breath, or abdominal pain.  No low back pain reported.  She has not taken any over-the-counter medications for her symptoms.  Denies diagnosis of diabetes.  No other aggravating or relieving factors identified for symptoms at this time.   Emesis Associated symptoms: diarrhea   Diarrhea Associated symptoms: vomiting     Past Medical History:  Diagnosis Date   Anemia 2003   ongoing, has had heavy periods, iron deficiency   Dysmenorrhea    Nexplanon in place 01/2012   Obesity     Patient Active Problem List   Diagnosis Date Noted   Nipple inflammation 02/12/2020   Leukocytosis 02/12/2020   Pneumonia due to COVID-19 virus 01/25/2020   Hypoxia 01/25/2020   Nonintractable headache 01/25/2020   Hot flashes 01/25/2020   Microcytic anemia  01/25/2020   COVID-19 virus infection 01/19/2020   Hypokalemia 01/19/2020   Acute hypoxemic respiratory failure due to COVID-19 ([place]) 01/19/2020   Polydipsia 11/20/2019   BMI 60.0-69.9, adult ([place]) 11/20/2019   DOE (dyspnea on exertion) 08/12/2019   Edema of both lower extremities 08/12/2019   Impaired fasting blood sugar 08/12/2019   Vitamin D deficiency 99991111   Folic acid deficiency 99991111   Witnessed apneic spells 07/20/2019   Daytime somnolence 07/20/2019   Snoring 07/20/2019   Abnormal urine odor 07/20/2019   Dysuria 07/20/2019   Weight gain 07/20/2019   Class 3 severe obesity due to excess calories with serious comorbidity and body mass index (BMI) of 60.0 to 69.9 in adult ([place]) 07/20/2019   Iron deficiency 07/20/2019   Prediabetes 10/27/2018   Upper back pain, chronic 07/16/2018   Breast hypertrophy in female 07/16/2018   Dysmenorrhea 08/15/2015   Iron deficiency anemia 08/15/2015   Nexplanon in place 08/15/2015   Morbid obesity ([place]) 08/15/2015   Routine general medical examination at a health care facility 08/15/2015   Screening for condition 08/15/2015   Urinary frequency 08/15/2015   Family history of thyroid disease 08/15/2015   Vaginal discharge 08/15/2015    Past Surgical History:  Procedure Laterality Date   DILATION AND CURETTAGE OF UTERUS  2013   INSERTION OF CONTRACEPTIVE CAPSULE  01/2012   nexplanon    OB  History   No obstetric history on file.      Home Medications    Prior to Admission medications   Medication Sig Start Date End Date Taking? Authorizing Provider  ondansetron (ZOFRAN-ODT) 4 MG disintegrating tablet Take 1 tablet (4 mg total) by mouth every 8 (eight) hours as needed for nausea or vomiting. 07/18/21  Yes Laneka Mcgrory, Stasia Cavalier, FNP  Semaglutide,0.25 or 0.5MG /DOS, (OZEMPIC, 0.25 OR 0.5 MG/DOSE,) 2 MG/3ML SOPN Inject 0.5 mg into the skin once a week. 05/01/21  Yes [provider]  acetaminophen (TYLENOL) 325 MG tablet  Take 650 mg by mouth every 6 (six) hours as needed for mild pain, fever or headache.    [provider]  albuterol (VENTOLIN HFA) 108 (90 Base) MCG/ACT inhaler Inhale 2 puffs into the lungs every 4 (four) hours as needed for wheezing or shortness of breath. 01/15/20   Quintella Reichert, MD  ascorbic acid (VITAMIN C) 1000 MG tablet Take 1 tablet (1,000 mg total) by mouth daily. Patient not taking: Reported on 08/04/2020 01/22/20   Patrecia Pour, MD  ferrous gluconate (FERGON) 324 MG tablet Take 1 tablet (324 mg total) by mouth 2 (two) times daily with a meal. Patient not taking: Reported on 08/04/2020 01/25/20   Tysinger, Camelia Eng, PA-C  Fluticasone-Salmeterol (ADVAIR DISKUS) 250-50 MCG/DOSE AEPB Inhale 1 puff into the lungs in the morning and at bedtime. Patient not taking: Reported on 08/04/2020 05/21/20 05/21/21  Tysinger, Camelia Eng, PA-C  ibuprofen (ADVIL) 800 MG tablet Take 1 tablet (800 mg total) by mouth 3 (three) times daily. 08/04/20   Alroy Bailiff, Margaux, PA-C  Insulin Pen Needle (BD PEN NEEDLE NANO U/F) 32G X 4 MM MISC 1 each by Does not apply route at bedtime. 08/13/19   Tysinger, Camelia Eng, PA-C  Insulin Pen Needle (BD PEN NEEDLE NANO U/F) 32G X 4 MM MISC 1 each by Does not apply route at bedtime. 11/20/19   Tysinger, Camelia Eng, PA-C  Multiple Vitamins-Minerals (EMERGEN-C IMMUNE PLUS) PACK Take 1 tablet by mouth 2 (two) times daily. Patient not taking: Reported on 08/04/2020 01/25/20   Tysinger, Camelia Eng, PA-C  Semaglutide,0.25 or 0.5MG /DOS, (OZEMPIC, 0.25 OR 0.5 MG/DOSE,) 2 MG/1.5ML SOPN Inject 0.5 mg into the skin once a week. Patient not taking: No sig reported 11/20/19   Tysinger, Camelia Eng, PA-C  zinc sulfate 220 (50 Zn) MG capsule Take 1 capsule (220 mg total) by mouth daily. Patient not taking: No sig reported 01/22/20   Patrecia Pour, MD    Family History Family History  Problem Relation Age of Onset   Hypertension Mother    Obesity Mother    Diabetes Maternal Aunt    Cancer Maternal  Grandmother        stomach   Diabetes Maternal Grandmother    Hypertension Maternal Grandmother    Cancer Other        stomach, cervical, bone, prostate, distant relatives   Diabetes Maternal Aunt    Stroke Neg Hx    Heart disease Neg Hx     Social History Social History   Tobacco Use   Smoking status: Never   Smokeless tobacco: Never  Vaping Use   Vaping Use: Never used  Substance Use Topics   Alcohol use: No    Alcohol/week: 0.0 standard drinks of alcohol   Drug use: No     Allergies   Patient has no known allergies.   Review of Systems Review of Systems  Gastrointestinal:  Positive for diarrhea and  vomiting.  Per HPI   Physical Exam Triage Vital Signs ED Triage Vitals  Enc Vitals Group     BP 07/18/21 1649 134/86     Pulse Rate 07/18/21 1649 80     Resp 07/18/21 1649 16     Temp 07/18/21 1649 98.1 F (36.7 C)     Temp Source 07/18/21 1649 Oral     SpO2 07/18/21 1649 100 %     Weight 07/18/21 1647 (!) 373 lb (169.2 kg)     Height 07/18/21 1647 5\' 2"  (1.575 m)     Head Circumference --      Peak Flow --      Pain Score 07/18/21 1647 0     Pain Loc --      Pain Edu? --      Excl. in GC? --    No data found.  Updated Vital Signs BP 134/86 (BP Location: Right Wrist)   Pulse 80   Temp 98.1 F (36.7 C) (Oral)   Resp 16   Ht 5\' 2"  (1.575 m)   Wt (!) 373 lb (169.2 kg)   LMP  (LMP Unknown)   SpO2 100%   BMI 68.22 kg/m   Visual Acuity Right Eye Distance:   Left Eye Distance:   Bilateral Distance:    Right Eye Near:   Left Eye Near:    Bilateral Near:     Physical Exam Vitals and nursing note reviewed.  Constitutional:      Appearance: Normal appearance. She is not ill-appearing or toxic-appearing.     Comments: Very pleasant patient sitting on exam in position of comfort table in no acute distress.   HENT:     Head: Normocephalic and atraumatic.     Right Ear: Hearing and external ear normal.     Left Ear: Hearing and external ear  normal.     Nose: Nose normal.     Mouth/Throat:     Lips: Pink.     Mouth: Mucous membranes are dry.     Pharynx: Oropharynx is clear. No oropharyngeal exudate or posterior oropharyngeal erythema.     Comments: Mouth appears mildly dry no posterior oropharynx erythema or drainage.  Airway is intact. Eyes:     General: Lids are normal. Vision grossly intact. Gaze aligned appropriately.     Extraocular Movements: Extraocular movements intact.     Conjunctiva/sclera: Conjunctivae normal.  Cardiovascular:     Rate and Rhythm: Normal rate and regular rhythm.     Heart sounds: Normal heart sounds, S1 normal and S2 normal.  Pulmonary:     Effort: Pulmonary effort is normal. No respiratory distress.     Breath sounds: Normal breath sounds and air entry.  Abdominal:     General: Bowel sounds are normal.     Palpations: Abdomen is soft.     Tenderness: There is no abdominal tenderness. There is no right CVA tenderness, left CVA tenderness or guarding.  Musculoskeletal:     Cervical back: Neck supple.  Skin:    General: Skin is warm and dry.     Capillary Refill: Capillary refill takes less than 2 seconds.     Findings: No rash.  Neurological:     General: No focal deficit present.     Mental Status: She is alert and oriented to person, place, and time. Mental status is at baseline.     Cranial Nerves: No dysarthria or facial asymmetry.     Motor: No weakness.  Gait: Gait is intact. Gait normal.  Psychiatric:        Mood and Affect: Mood normal.        Speech: Speech normal.        Behavior: Behavior normal.        Thought Content: Thought content normal.        Judgment: Judgment normal.      UC Treatments / Results  Labs (all labs ordered are listed, but only abnormal results are displayed) Labs Reviewed - No data to display  EKG   Radiology No results found.  Procedures Procedures (including critical care time)  Medications Ordered in UC Medications  ondansetron  (ZOFRAN-ODT) disintegrating tablet 4 mg (4 mg Oral Given 07/18/21 1732)    Initial Impression / Assessment and Plan / UC Course  I have reviewed the triage vital signs and the nursing notes.  Pertinent labs & imaging results that were available during my care of the patient were reviewed by me and considered in my medical decision making (see chart for details).  Viral gastroenteritis infection Patient symptoms and physical exam are consistent with viral gastroenteritis caused by intake of possible undercooked meat at a [employer] last night.  Abdominal exam is benign at this time.  Mouth appears mildly dry, but patient is tolerating liquids at this time and appears to be mildly dehydrated.  Plan to treat with supportive care prescription for Zofran 4 mg to be taken every 8 hours at home.  Patient given a dose of 4 mg Zofran ODT in clinic.  Encouraged her to increase her water intake to at least 64 ounces of water per day to avoid dehydration.  She is to eat a bland diet for the next 12 to 24 hours including bananas, toast, rice, and applesauce.  She may increase her diet as tolerated.  If symptoms do not improve in the next 2 to 3 days and she continues to vomit and have diarrhea, she needs to return to urgent care for follow-up.    Counseled patient regarding appropriate use of medications and potential side effects for all medications recommended or prescribed today. Discussed red flag signs and symptoms of worsening condition,when to call the PCP office, return to urgent care, and when to seek higher level of care. Patient verbalizes understanding and agreement with plan. All questions answered. Patient discharged in stable condition.  Final Clinical Impressions(s) / UC Diagnoses   Final diagnoses:  Nausea and vomiting, unspecified vomiting type  Gastroenteritis     Discharge Instructions      You have viral gastroenteritis. Take Zofran 4 mg under the tongue every 8 hours as  needed.  You may have more of this medication tonight at 1 AM since you were given some in the clinic. Drink plenty of water and eat a bland diet including bananas, toast, rice, and applesauce for the next 12 to 24 hours.  Avoid spicy foods while you are recovering from this illness.  You may increase your diet as tolerated.  Work note is at the end of the packet.  If you do not improve in the next 2 to 3 days with the medicine or if you continue to have diarrhea, you need to return to urgent care for further evaluation.  If you develop any new or worsening symptoms or do not improve in the next 2 to 3 days, please return.  If your symptoms are severe, please go to the emergency room.  Follow-up with your primary care provider  for further evaluation and management of your symptoms as well as ongoing wellness visits.  I hope you feel better!     ED Prescriptions     Medication Sig Dispense Auth. Provider   ondansetron (ZOFRAN-ODT) 4 MG disintegrating tablet Take 1 tablet (4 mg total) by mouth every 8 (eight) hours as needed for nausea or vomiting. 20 tablet Carlisle Beers, FNP      PDMP not reviewed this encounter.   Carlisle Beers, [place] 07/18/21 1736

## 2021-08-08 IMAGING — CT CT ANGIO CHEST
3 of 7 series · 17 of 36 positions shown · IV contrast (omnipaque)
Comparison: None.

CLINICAL DATA: Mid back and chest pain with inspiration, fatigue,
high prob PE study

EXAM:
CT ANGIOGRAPHY CHEST WITH CONTRAST
TECHNIQUE: Multidetector CT imaging of the chest was performed using the
standard protocol during bolus administration of intravenous
contrast. Multiplanar CT image reconstructions and MIPs were
obtained to evaluate the vascular anatomy.
CONTRAST:  100mL OMNIPAQUE IOHEXOL 350 MG/ML SOLN
Patient was scanned with her arm by her side due to tenuous
ultrasound-guided IV placement.

[Series 7: thins · axial · 0.76mm/px · z∈[+1324,+1566]mm · 12 of 286 slices shown]
[im 22/286  lung]
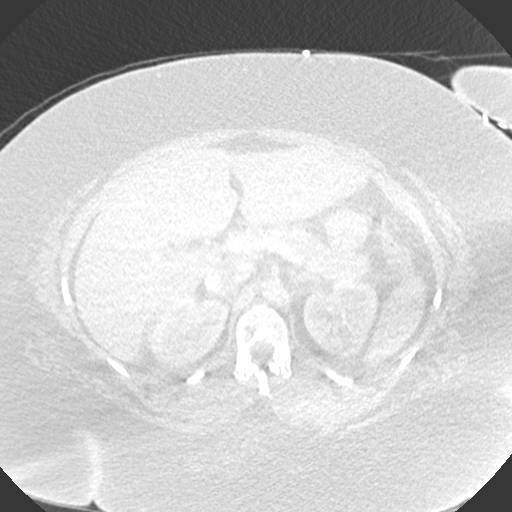
[im 44/286  mediastinal]
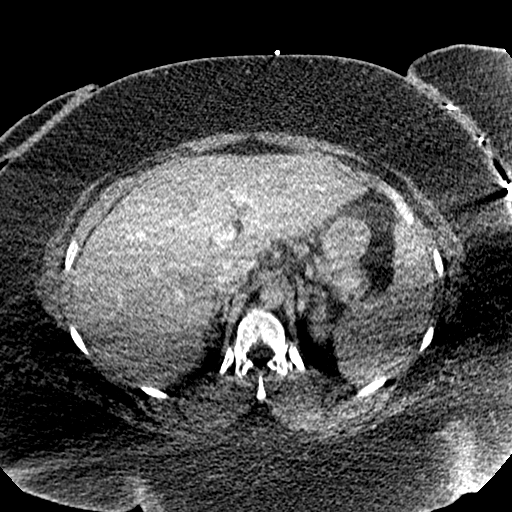
[im 66/286  lung]
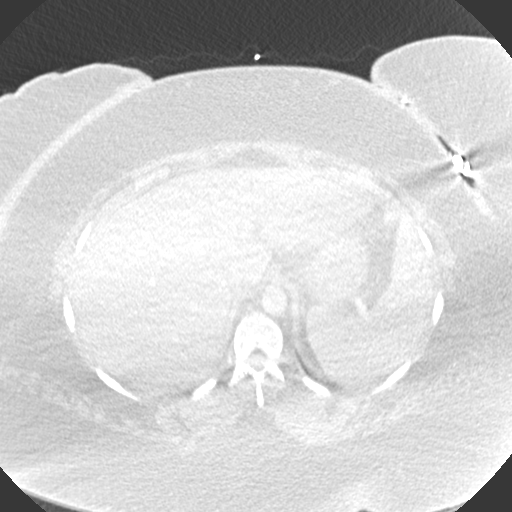
[im 88/286  mediastinal]
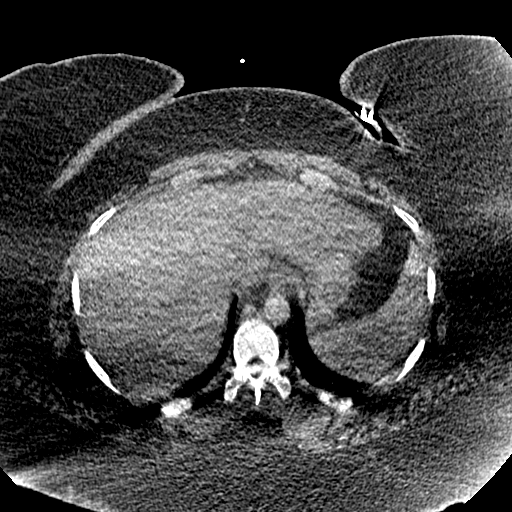
[im 110/286  lung]
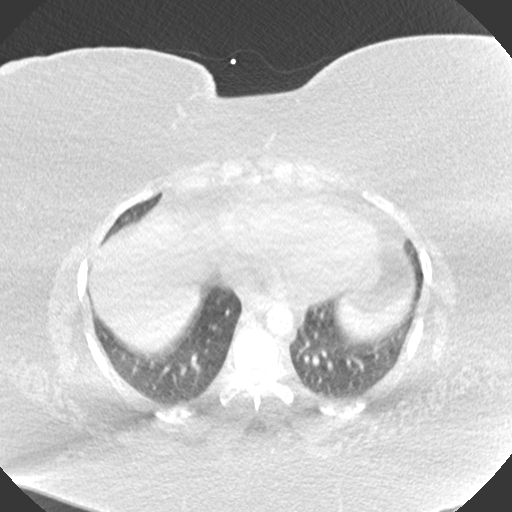
[im 132/286  mediastinal]
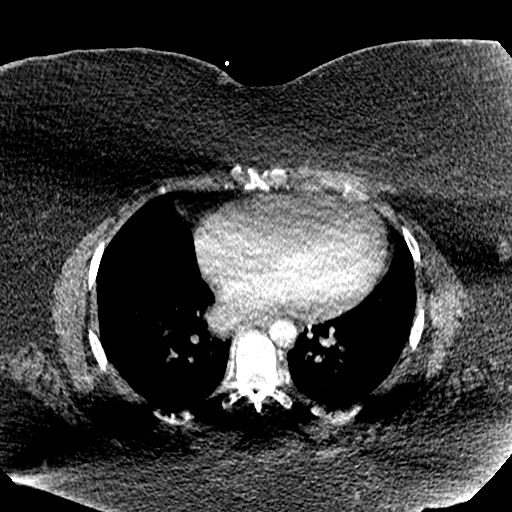
[im 154/286  lung]
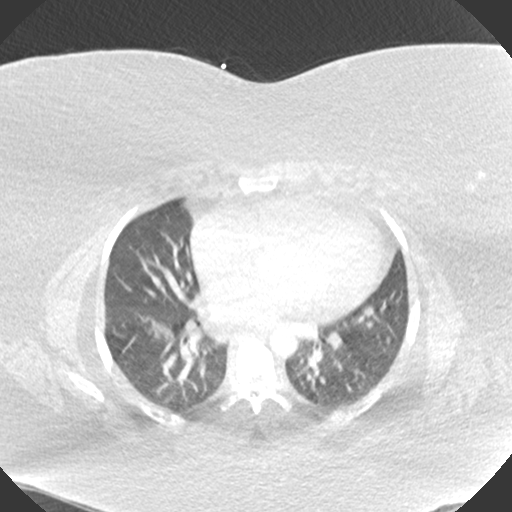
[im 176/286  mediastinal]
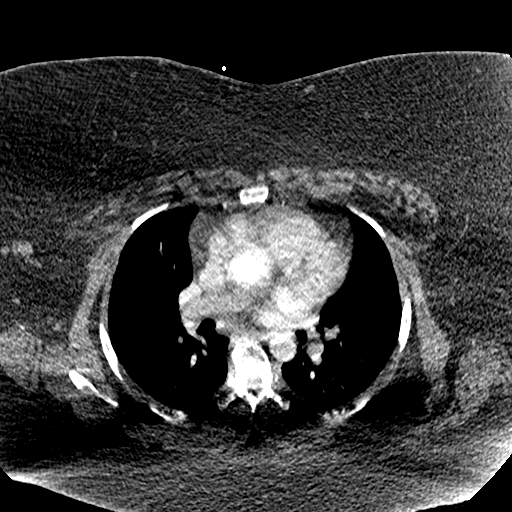
[im 198/286  lung]
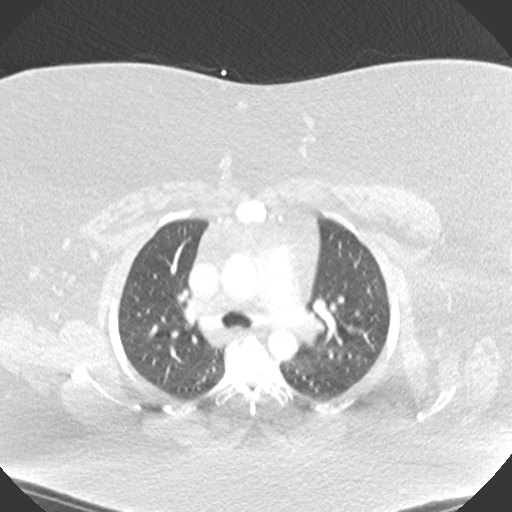
[im 220/286  mediastinal]
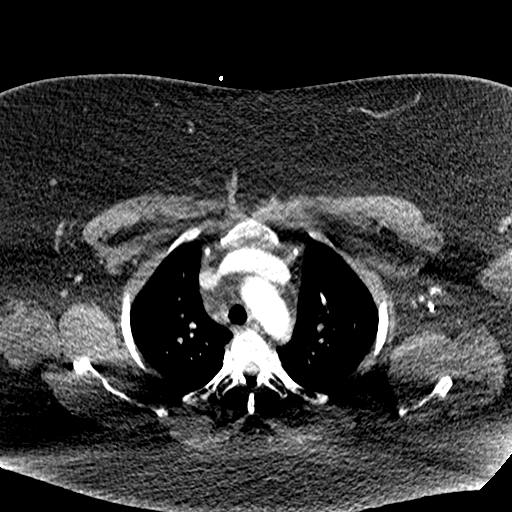
[im 242/286  lung]
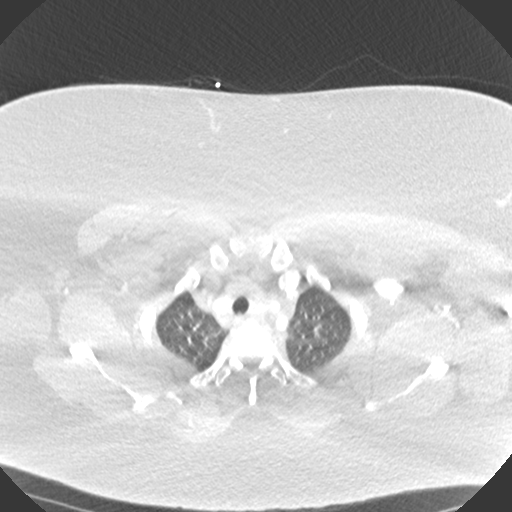
[im 264/286  mediastinal]
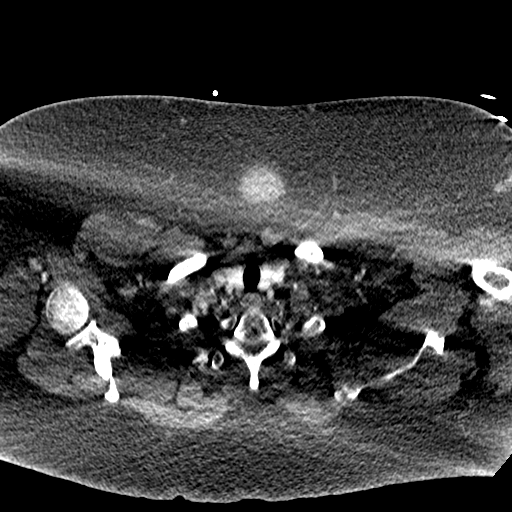

[Series 8: lung · axial · 0.76mm/px · z∈[+1388,+1538]mm · 4 of 126 slices shown]
[im 26/126  mediastinal]
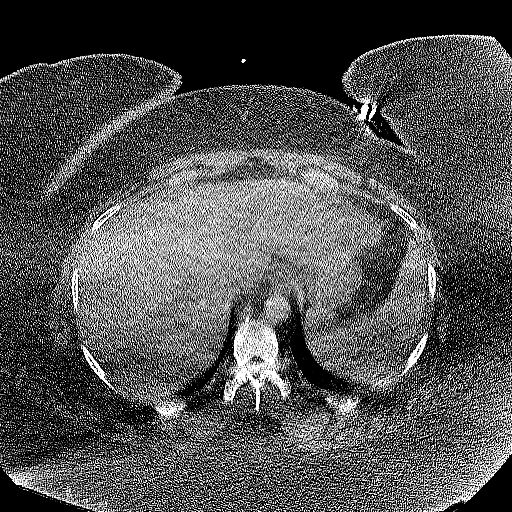
[im 51/126  mediastinal]
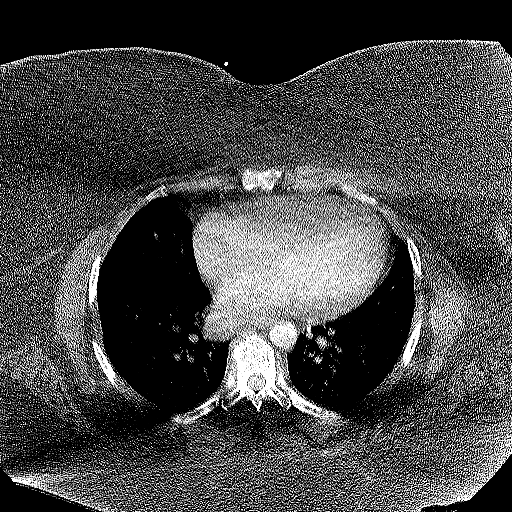
[im 76/126  mediastinal]
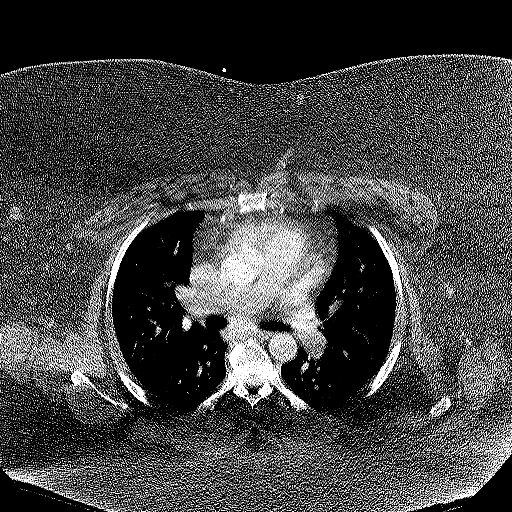
[im 101/126  mediastinal]
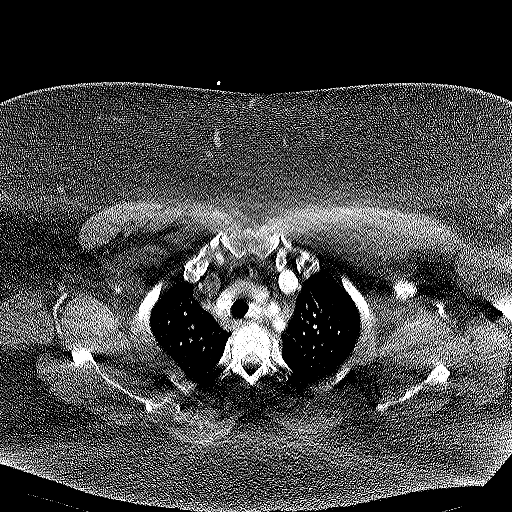

[Series 9: coronal mpr · coronal · 0.63mm/px · 1 of 200 slices shown]
[im 100/200  mediastinal]
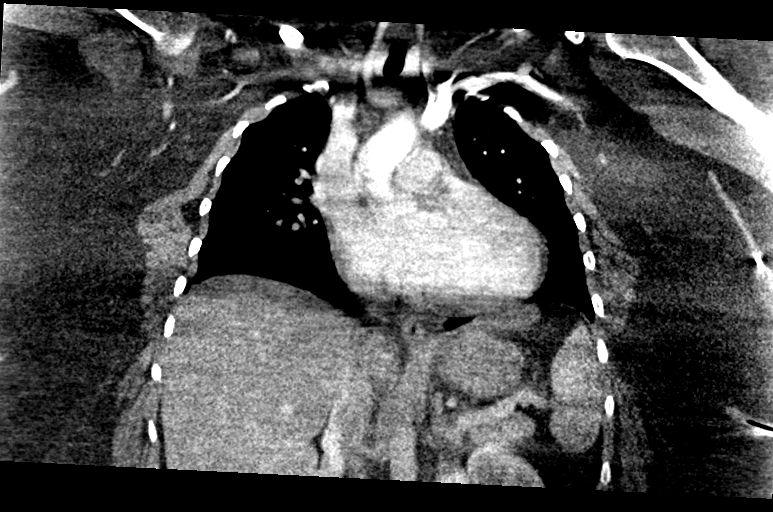

[17 of 36 positions shown; findings below may reference images not displayed]

FINDINGS: Cardiovascular: Heart size normal. No pericardial effusion. Limited
contrast opacification of the pulmonary arterial tree. Additionally,
significant streak artifact degrades evaluation of central and
segmental pulmonary arteries. Adequate contrast opacification of the
thoracic aorta with no evidence of dissection, aneurysm, or
stenosis. There is classic 3-vessel brachiocephalic arch anatomy
without proximal stenosis.

Mediastinum/Nodes: No mass or adenopathy.

Lungs/Pleura: No pleural effusion. No pneumothorax. Lungs are clear.

Upper Abdomen: No acute findings.

Musculoskeletal: Obesity.  No fracture or worrisome bone lesion.

Review of the MIP images confirms the above findings.
IMPRESSION: 1. The study is nondiagnostic for evaluation of pulmonary emboli.
If clinical concern persists, consider perfusion scintigraphy for
further evaluation. Given body habitus and IV access issues,
reinjection and rescanning the CTA is less likely to provide
additional diagnostic information.
I reviewed the case with Dr. Yujen, who concurs.
2. No acute findings.

## 2021-10-04 ENCOUNTER — Encounter: Payer: Self-pay | Admitting: Internal Medicine

## 2021-11-07 ENCOUNTER — Encounter: Payer: Self-pay | Admitting: Internal Medicine

## 2022-07-29 ENCOUNTER — Emergency Department (HOSPITAL_BASED_OUTPATIENT_CLINIC_OR_DEPARTMENT_OTHER): Payer: Managed Care, Other (non HMO) | Admitting: Radiology

## 2022-07-29 ENCOUNTER — Emergency Department (HOSPITAL_BASED_OUTPATIENT_CLINIC_OR_DEPARTMENT_OTHER)
Admission: EM | Admit: 2022-07-29 | Discharge: 2022-07-29 | Disposition: A | Discharge status: Home / Self Care | Payer: Managed Care, Other (non HMO) | Attending: Emergency Medicine | Admitting: Emergency Medicine

## 2022-07-29 ENCOUNTER — Other Ambulatory Visit: Payer: Self-pay

## 2022-07-29 ENCOUNTER — Encounter (HOSPITAL_BASED_OUTPATIENT_CLINIC_OR_DEPARTMENT_OTHER): Payer: Self-pay | Admitting: Emergency Medicine

## 2022-07-29 DIAGNOSIS — Z794 Long term (current) use of insulin: Secondary | ICD-10-CM | POA: Insufficient documentation

## 2022-07-29 DIAGNOSIS — R112 Nausea with vomiting, unspecified: Secondary | ICD-10-CM | POA: Diagnosis present

## 2022-07-29 DIAGNOSIS — K529 Noninfective gastroenteritis and colitis, unspecified: Secondary | ICD-10-CM | POA: Diagnosis not present

## 2022-07-29 LAB — CBC WITH DIFFERENTIAL/PLATELET
Abs Immature Granulocytes: 0.04 10*3/uL (ref 0.00–0.07)
Basophils Absolute: 0 10*3/uL (ref 0.0–0.1)
Basophils Relative: 0 %
Eosinophils Absolute: 0.1 10*3/uL (ref 0.0–0.5)
Eosinophils Relative: 1 %
HCT: 39.2 % (ref 36.0–46.0)
Hemoglobin: 12.3 g/dL (ref 12.0–15.0)
Immature Granulocytes: 1 %
Lymphocytes Relative: 31 %
Lymphs Abs: 2.6 10*3/uL (ref 0.7–4.0)
MCH: 24.8 pg — ABNORMAL LOW (ref 26.0–34.0)
MCHC: 31.4 g/dL (ref 30.0–36.0)
MCV: 79 fL — ABNORMAL LOW (ref 80.0–100.0)
Monocytes Absolute: 0.7 10*3/uL (ref 0.1–1.0)
Monocytes Relative: 8 %
Neutro Abs: 5 10*3/uL (ref 1.7–7.7)
Neutrophils Relative %: 59 %
Platelets: 310 10*3/uL (ref 150–400)
RBC: 4.96 MIL/uL (ref 3.87–5.11)
RDW: 16.4 % — ABNORMAL HIGH (ref 11.5–15.5)
WBC: 8.5 10*3/uL (ref 4.0–10.5)
nRBC: 0 % (ref 0.0–0.2)

## 2022-07-29 LAB — COMPREHENSIVE METABOLIC PANEL
ALT: 17 U/L (ref 0–44)
AST: 10 U/L — ABNORMAL LOW (ref 15–41)
Albumin: 3.8 g/dL (ref 3.5–5.0)
Alkaline Phosphatase: 61 U/L (ref 38–126)
Anion gap: 6 (ref 5–15)
BUN: 10 mg/dL (ref 6–20)
CO2: 28 mmol/L (ref 22–32)
Calcium: 9 mg/dL (ref 8.9–10.3)
Chloride: 105 mmol/L (ref 98–111)
Creatinine, Ser: 0.66 mg/dL (ref 0.44–1.00)
GFR, Estimated: >60 mL/min (ref >60)
Glucose, Bld: 86 mg/dL (ref 70–99)
Potassium: 3.6 mmol/L (ref 3.5–5.1)
Sodium: 139 mmol/L (ref 135–145)
Total Bilirubin: 0.4 mg/dL (ref 0.3–1.2)
Total Protein: 7.3 g/dL (ref 6.5–8.1)

## 2022-07-29 LAB — PHOSPHORUS: Phosphorus: 2.9 mg/dL (ref 2.5–4.6)

## 2022-07-29 LAB — PREGNANCY, URINE: Preg Test, Ur: NEGATIVE

## 2022-07-29 LAB — LIPASE, BLOOD: Lipase: 12 U/L (ref 11–51)

## 2022-07-29 LAB — MAGNESIUM: Magnesium: 2 mg/dL (ref 1.7–2.4)

## 2022-07-29 MED ORDER — ONDANSETRON 4 MG PO TBDP: 4.0000 mg | ORAL_TABLET | Freq: Three times a day (TID) | ORAL | 0 refills | Status: AC | PRN

## 2022-07-29 MED ORDER — ONDANSETRON HCL 4 MG/2ML IJ SOLN
4.0000 mg | Freq: Once | INTRAMUSCULAR | Status: AC
  Administered 2022-07-29: 4 mg via INTRAVENOUS
  Filled 2022-07-29: qty 2

## 2022-07-29 MED ORDER — SODIUM CHLORIDE 0.9 % IV BOLUS
1000.0000 mL | Freq: Once | INTRAVENOUS | Status: AC
  Administered 2022-07-29: 1000 mL via INTRAVENOUS

## 2022-07-29 NOTE — ED Notes (Signed)
Discharge paperwork given and verbally understood. 

## 2022-07-29 NOTE — ED Notes (Signed)
Report given to the next RN... 

## 2022-07-29 NOTE — ED Notes (Signed)
Pt was given sprite and crackers for PO challenge 

## 2022-07-29 NOTE — ED Triage Notes (Signed)
N/v/d intermittent since last Sunday. Pt has poor appetite. Today she says she is still nauseated and some diarrhea. Pt is able to void, not keeping many fluids down.

## 2022-07-29 NOTE — Discharge Instructions (Addendum)
Your labs are reassuring today.  I believe that you likely have a GI bug, which is causes persistent diarrhea, nausea and vomiting.  Make sure you are drinking lots of fluids, and resting.  I have prescribed you some nausea medicine to, help with your nausea.  Return to the ER if you feel like your symptoms are getting worse.

## 2022-07-29 NOTE — ED Provider Notes (Signed)
[facility] AT [facility] Provider Note   CSN: [id number] Arrival date & time: 07/29/22  1432     History  No chief complaint on file.   Tamara Rowland is a 42 y.o. female, pertinent past medical history, who presents to the ED secondary to nausea, vomiting, diarrhea for the last week.  She states that she traveled from [place] last week, and stopped at [place] and got first thing on the way home.  She states since then she started having some nausea, vomiting and diarrhea.  Initially was having about 10+ episodes of diarrhea and vomiting the first day, which slowed down to 4 episodes of diarrhea and vomiting the next 2 days, since then has just felt very weak, and nauseated.  Denies any blood in the stools or vomit.  She states that the diarrhea and the vomiting has improved, however today she feels so sick to her stomach she cannot even eat.  Feels a little weak and dizzy when she stands.  Denies any current abdominal pain, urinary symptoms, or vaginal discharge.  Denies any recent antibiotic use.    Home Medications Prior to Admission medications   Medication Sig Start Date End Date Taking? Authorizing Provider  ondansetron (ZOFRAN-ODT) 4 MG disintegrating tablet Take 1 tablet (4 mg total) by mouth every 8 (eight) hours as needed for nausea or vomiting. 07/29/22  Yes Keshia Weare L, PA  acetaminophen (TYLENOL) 325 MG tablet Take 650 mg by mouth every 6 (six) hours as needed for mild pain, fever or headache.    [provider]  albuterol (VENTOLIN HFA) 108 (90 Base) MCG/ACT inhaler Inhale 2 puffs into the lungs every 4 (four) hours as needed for wheezing or shortness of breath. 01/15/20   Tilden Fossa, MD  ascorbic acid (VITAMIN C) 1000 MG tablet Take 1 tablet (1,000 mg total) by mouth daily. Patient not taking: Reported on 08/04/2020 01/22/20   Tyrone Nine, MD  ferrous gluconate (FERGON) 324 MG tablet Take 1 tablet (324 mg total) by mouth 2 (two)  times daily with a meal. Patient not taking: Reported on 08/04/2020 01/25/20   Tysinger, [name], PA-C  Fluticasone-Salmeterol (ADVAIR DISKUS) 250-50 MCG/DOSE AEPB Inhale 1 puff into the lungs in the morning and at bedtime. Patient not taking: Reported on 08/04/2020 05/21/20 05/21/21  Tysinger, [name], PA-C  ibuprofen (ADVIL) 800 MG tablet Take 1 tablet (800 mg total) by mouth 3 (three) times daily. 08/04/20   Hyman Hopes, Margaux, PA-C  Insulin Pen Needle (BD PEN NEEDLE NANO U/F) 32G X 4 MM MISC 1 each by Does not apply route at bedtime. 08/13/19   Tysinger, [name], PA-C  Insulin Pen Needle (BD PEN NEEDLE NANO U/F) 32G X 4 MM MISC 1 each by Does not apply route at bedtime. 11/20/19   Tysinger, [name], PA-C  Multiple Vitamins-Minerals (EMERGEN-C IMMUNE PLUS) PACK Take 1 tablet by mouth 2 (two) times daily. Patient not taking: Reported on 08/04/2020 01/25/20   Tysinger, [name], PA-C  Semaglutide,0.25 or 0.5MG /DOS, (OZEMPIC, 0.25 OR 0.5 MG/DOSE,) 2 MG/1.5ML SOPN Inject 0.5 mg into the skin once a week. Patient not taking: No sig reported 11/20/19   Tysinger, [name], PA-C  Semaglutide,0.25 or 0.5MG /DOS, (OZEMPIC, 0.25 OR 0.5 MG/DOSE,) 2 MG/3ML SOPN Inject 0.5 mg into the skin once a week. 05/01/21   [provider]  zinc sulfate 220 (50 Zn) MG capsule Take 1 capsule (220 mg total) by mouth daily. Patient not taking: No sig reported 01/22/20  Tyrone Nine, MD      Allergies    Patient has no known allergies.    Review of Systems   Review of Systems  Constitutional:  Negative for fever.  Gastrointestinal:  Positive for diarrhea, nausea and vomiting.    Physical Exam Updated Vital Signs BP 136/84   Pulse 84   Temp 98.9 F (37.2 C) (Oral)   Resp 20   SpO2 99%  Physical Exam Vitals and nursing note reviewed.  Constitutional:      General: She is not in acute distress.    Appearance: She is well-developed.  HENT:     Head: Normocephalic and atraumatic.  Eyes:     Conjunctiva/sclera:  Conjunctivae normal.  Cardiovascular:     Rate and Rhythm: Normal rate and regular rhythm.     Heart sounds: No murmur heard. Pulmonary:     Effort: Pulmonary effort is normal. No respiratory distress.     Breath sounds: Normal breath sounds.  Abdominal:     Palpations: Abdomen is soft.     Tenderness: There is no abdominal tenderness.  Musculoskeletal:        General: No swelling.     Cervical back: Neck supple.  Skin:    General: Skin is warm and dry.     Capillary Refill: Capillary refill takes less than 2 seconds.  Neurological:     Mental Status: She is alert.  Psychiatric:        Mood and Affect: Mood normal.     ED Results / Procedures / Treatments   Labs (all labs ordered are listed, but only abnormal results are displayed) Labs Reviewed  CBC WITH DIFFERENTIAL/PLATELET - Abnormal; Notable for the following components:      Result Value   MCV 79.0 (*)    MCH 24.8 (*)    RDW 16.4 (*)    All other components within normal limits  COMPREHENSIVE METABOLIC PANEL - Abnormal; Notable for the following components:   AST 10 (*)    All other components within normal limits  MAGNESIUM  PHOSPHORUS  LIPASE, BLOOD  PREGNANCY, URINE    EKG None  Radiology DG Abdomen 1 View  Result Date: 07/29/2022 CLINICAL DATA:  Eval for obstruction. Diarrhea and nausea for 1 week. EXAM: ABDOMEN - 1 VIEW COMPARISON:  None Available. FINDINGS: The bowel gas pattern is normal. No radio-opaque calculi or other significant radiographic abnormality are seen. IMPRESSION: Negative. Electronically Signed   By: Sherron Ales M.D.   On: 07/29/2022 16:54    Procedures Procedures    Medications Ordered in ED Medications  sodium chloride 0.9 % bolus 1,000 mL (0 mLs Intravenous Stopped 07/29/22 1657)  ondansetron (ZOFRAN) injection 4 mg (4 mg Intravenous Given 07/29/22 1526)    ED Course/ Medical Decision Making/ A&P                             Medical Decision Making Patient is a 42 year old  female, here for nausea, vomiting, diarrhea has been going on for the last week.  She states she ate a burger from [employer], and she states she got sick the same night.  Since then has had diarrhea, nausea, vomiting which is slowing down in nature.  We will obtain electrolytes for further evaluations as well as KUB to evaluate for possible obstruction.  She is not having any bloody stools.  Zofran and fluids for hydration  Amount and/or Complexity of Data Reviewed  Labs: ordered.    Details: No acute findings, no AKI, or leukocytosis Radiology: ordered.    Details: KUB unremarkable Discussion of management or test interpretation with external provider(s): Discussed with patient, likely GI bug, from fast food, she does not have any bloody stool fevers, chills, is overall well-appearing.  We p.o. trial patient, and she was able to tolerate.  Her labs are reassuring.  We discussed taking Zofran for nausea, and she was requesting to go home.  Discharged with return precautions. her abdomen was soft and nontender on exam.  Risk Prescription drug management.     Final Clinical Impression(s) / ED Diagnoses Final diagnoses:  Gastroenteritis    Rx / DC Orders ED Discharge Orders          Ordered    ondansetron (ZOFRAN-ODT) 4 MG disintegrating tablet  Every 8 hours PRN        07/29/22 1727              Sanjana Folz, [place]ley [place], PA 07/29/22 1730    Tegeler, Canary Brim, MD 07/29/22 1756

## 2022-12-19 ENCOUNTER — Encounter: Payer: PRIVATE HEALTH INSURANCE | Admitting: Medical

## 2023-03-02 ENCOUNTER — Encounter (HOSPITAL_BASED_OUTPATIENT_CLINIC_OR_DEPARTMENT_OTHER): Payer: Self-pay

## 2023-03-02 ENCOUNTER — Other Ambulatory Visit: Payer: Self-pay

## 2023-03-02 ENCOUNTER — Emergency Department (HOSPITAL_BASED_OUTPATIENT_CLINIC_OR_DEPARTMENT_OTHER)
Admission: EM | Admit: 2023-03-02 | Discharge: 2023-03-03 | Disposition: A | Discharge status: Home / Self Care | Payer: Managed Care, Other (non HMO) | Attending: Emergency Medicine | Admitting: Emergency Medicine

## 2023-03-02 DIAGNOSIS — M79661 Pain in right lower leg: Secondary | ICD-10-CM

## 2023-03-02 DIAGNOSIS — M7989 Other specified soft tissue disorders: Secondary | ICD-10-CM | POA: Diagnosis present

## 2023-03-02 DIAGNOSIS — R600 Localized edema: Secondary | ICD-10-CM

## 2023-03-02 DIAGNOSIS — Z794 Long term (current) use of insulin: Secondary | ICD-10-CM | POA: Diagnosis not present

## 2023-03-02 DIAGNOSIS — R200 Anesthesia of skin: Secondary | ICD-10-CM | POA: Insufficient documentation

## 2023-03-02 LAB — CBC WITH DIFFERENTIAL/PLATELET
Abs Immature Granulocytes: 0.05 10*3/uL (ref 0.00–0.07)
Basophils Absolute: 0 10*3/uL (ref 0.0–0.1)
Basophils Relative: 0 %
Eosinophils Absolute: 0.2 10*3/uL (ref 0.0–0.5)
Eosinophils Relative: 2 %
HCT: 38.7 % (ref 36.0–46.0)
Hemoglobin: 12 g/dL (ref 12.0–15.0)
Immature Granulocytes: 1 %
Lymphocytes Relative: 37 %
Lymphs Abs: 3.3 10*3/uL (ref 0.7–4.0)
MCH: 23.9 pg — ABNORMAL LOW (ref 26.0–34.0)
MCHC: 31 g/dL (ref 30.0–36.0)
MCV: 77.1 fL — ABNORMAL LOW (ref 80.0–100.0)
Monocytes Absolute: 0.6 10*3/uL (ref 0.1–1.0)
Monocytes Relative: 6 %
Neutro Abs: 4.8 10*3/uL (ref 1.7–7.7)
Neutrophils Relative %: 54 %
Platelets: 340 10*3/uL (ref 150–400)
RBC: 5.02 MIL/uL (ref 3.87–5.11)
RDW: 17.8 % — ABNORMAL HIGH (ref 11.5–15.5)
WBC: 8.9 10*3/uL (ref 4.0–10.5)
nRBC: 0 % (ref 0.0–0.2)

## 2023-03-02 NOTE — ED Triage Notes (Signed)
Bilateral lower leg pain started yesterday eve

## 2023-03-02 NOTE — ED Notes (Signed)
Pt reports bilateral below the knee numbing pain that started yesterday night. Sts usual day yesterday. Lower legs are painful to touch. Sensation intact. No injury. No pitting edema. 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

## 2023-03-02 NOTE — ED Notes (Signed)
Pt ambulatory from lobby to room.  

## 2023-03-02 NOTE — ED Provider Notes (Signed)
[place] [facility] AT [facility] Provider Note   CSN: [id number] Arrival date & time: 03/02/23  2026     History {Add pertinent medical, surgical, social history, OB history to HPI:1} Chief Complaint  Patient presents with   Leg Pain    Tamara Rowland is a 43 y.o. female.  The history is provided by the patient.  Leg Pain She has no significant history and comes in because of bilateral leg pain and swelling which started this morning.  She states that her lower legs both are numb and painful and she has noted some red spots on her right leg.  She has not taken anything for pain.  She denies any back pain or chest pain or shortness of breath.  She denies any fever or chills or nausea or vomiting.  She denies any trauma.  She has not had similar symptoms before.   Home Medications Prior to Admission medications   Medication Sig Start Date End Date Taking? Authorizing Provider  acetaminophen (TYLENOL) 325 MG tablet Take 650 mg by mouth every 6 (six) hours as needed for mild pain, fever or headache.    [provider]  albuterol (VENTOLIN HFA) 108 (90 Base) MCG/ACT inhaler Inhale 2 puffs into the lungs every 4 (four) hours as needed for wheezing or shortness of breath. 01/15/20   Tilden Fossa, MD  ascorbic acid (VITAMIN C) 1000 MG tablet Take 1 tablet (1,000 mg total) by mouth daily. Patient not taking: Reported on 08/04/2020 01/22/20   Tyrone Nine, MD  ferrous gluconate (FERGON) 324 MG tablet Take 1 tablet (324 mg total) by mouth 2 (two) times daily with a meal. Patient not taking: Reported on 08/04/2020 01/25/20   Tysinger, [name], PA-C  Fluticasone-Salmeterol (ADVAIR DISKUS) 250-50 MCG/DOSE AEPB Inhale 1 puff into the lungs in the morning and at bedtime. Patient not taking: Reported on 08/04/2020 05/21/20 05/21/21  Tysinger, [name], PA-C  ibuprofen (ADVIL) 800 MG tablet Take 1 tablet (800 mg total) by mouth 3 (three) times daily. 08/04/20   Hyman Hopes, Margaux,  PA-C  Insulin Pen Needle (BD PEN NEEDLE NANO U/F) 32G X 4 MM MISC 1 each by Does not apply route at bedtime. 08/13/19   Tysinger, [name], PA-C  Insulin Pen Needle (BD PEN NEEDLE NANO U/F) 32G X 4 MM MISC 1 each by Does not apply route at bedtime. 11/20/19   Tysinger, [name], PA-C  Multiple Vitamins-Minerals (EMERGEN-C IMMUNE PLUS) PACK Take 1 tablet by mouth 2 (two) times daily. Patient not taking: Reported on 08/04/2020 01/25/20   Tysinger, [name], PA-C  ondansetron (ZOFRAN-ODT) 4 MG disintegrating tablet Take 1 tablet (4 mg total) by mouth every 8 (eight) hours as needed for nausea or vomiting. 07/29/22   Small, Brooke L, PA  Semaglutide,0.25 or 0.5MG /DOS, (OZEMPIC, 0.25 OR 0.5 MG/DOSE,) 2 MG/1.5ML SOPN Inject 0.5 mg into the skin once a week. Patient not taking: No sig reported 11/20/19   Tysinger, [name], PA-C  Semaglutide,0.25 or 0.5MG /DOS, (OZEMPIC, 0.25 OR 0.5 MG/DOSE,) 2 MG/3ML SOPN Inject 0.5 mg into the skin once a week. 05/01/21   [provider]  zinc sulfate 220 (50 Zn) MG capsule Take 1 capsule (220 mg total) by mouth daily. Patient not taking: No sig reported 01/22/20   Tyrone Nine, MD      Allergies    Patient has no known allergies.    Review of Systems   Review of Systems  All other systems reviewed and are  negative.   Physical Exam Updated Vital Signs BP (!) 161/100 (BP Location: Left Arm)   Pulse 94   Temp 98.8 F (37.1 C) (Oral)   Resp 20   Ht 5\' 2"  (1.575 m)   Wt (!) 169 kg   SpO2 100%   BMI 68.15 kg/m  Physical Exam Vitals and nursing note reviewed.   43 year old female, resting comfortably and in no acute distress. Vital signs are significant for elevated blood pressure. Oxygen saturation is 100%, which is normal. Head is normocephalic and atraumatic. PERRLA, EOMI.  Back is nontender and there is no CVA tenderness.  There is no presacral edema. Lungs are clear without rales, wheezes, or rhonchi. Chest is nontender. Heart has regular rate and  rhythm without murmur. Abdomen is soft, flat, nontender. Extremities have 2+ edema, full range of motion is present.  Calf circumference is symmetric.  There is an area of erythema noted on the anterior aspect of the right lower leg.  Both lower legs are tender to palpation.   Neurologic: Mental status is normal, cranial nerves are intact, moves all extremities equally.  ED Results / Procedures / Treatments   Labs (all labs ordered are listed, but only abnormal results are displayed) Labs Reviewed - No data to display  EKG None  Radiology No results found.  Procedures Procedures  {Document cardiac monitor, telemetry assessment procedure when appropriate:1}  Medications Ordered in ED Medications - No data to display  ED Course/ Medical Decision Making/ A&P   {   Click here for ABCD2, HEART and other calculatorsREFRESH Note before signing :1}                              Medical Decision Making  Peripheral edema with associated tenderness and erythema noted on the right leg.  Specific etiology is not clear.  Consider heart failure, cellulitis, allergic reaction.  I have ordered laboratory workup with CBC, comprehensive metabolic panel, urinalysis.  {Document critical care time when appropriate:1} {Document review of labs and clinical decision tools ie heart score, Chads2Vasc2 etc:1}  {Document your independent review of radiology images, and any outside records:1} {Document your discussion with family members, caretakers, and with consultants:1} {Document social determinants of health affecting pt's care:1} {Document your decision making why or why not admission, treatments were needed:1} Final Clinical Impression(s) / ED Diagnoses Final diagnoses:  None    Rx / DC Orders ED Discharge Orders     None

## 2023-03-03 LAB — COMPREHENSIVE METABOLIC PANEL
ALT: 26 U/L (ref 0–44)
AST: 13 U/L — ABNORMAL LOW (ref 15–41)
Albumin: 4 g/dL (ref 3.5–5.0)
Alkaline Phosphatase: 63 U/L (ref 38–126)
Anion gap: 8 (ref 5–15)
BUN: 11 mg/dL (ref 6–20)
CO2: 26 mmol/L (ref 22–32)
Calcium: 9 mg/dL (ref 8.9–10.3)
Chloride: 108 mmol/L (ref 98–111)
Creatinine, Ser: 0.67 mg/dL (ref 0.44–1.00)
GFR, Estimated: >60 mL/min (ref >60)
Glucose, Bld: 96 mg/dL (ref 70–99)
Potassium: 3.5 mmol/L (ref 3.5–5.1)
Sodium: 142 mmol/L (ref 135–145)
Total Bilirubin: 0.6 mg/dL (ref 0.0–1.2)
Total Protein: 7.4 g/dL (ref 6.5–8.1)

## 2023-03-03 LAB — URINALYSIS, ROUTINE W REFLEX MICROSCOPIC
Bilirubin Urine: NEGATIVE
Glucose, UA: NEGATIVE mg/dL
Ketones, ur: NEGATIVE mg/dL
Leukocytes,Ua: NEGATIVE
Nitrite: NEGATIVE
Specific Gravity, Urine: 1.026 (ref 1.005–1.030)
pH: 6.5 (ref 5.0–8.0)

## 2023-03-03 MED ORDER — FUROSEMIDE 40 MG PO TABS: 40.0000 mg | ORAL_TABLET | Freq: Every day | ORAL | 0 refills | Status: AC

## 2023-03-03 MED ORDER — PREDNISONE 50 MG PO TABS: 50.0000 mg | ORAL_TABLET | Freq: Every day | ORAL | 0 refills | Status: DC

## 2023-03-03 MED ORDER — PREDNISONE 50 MG PO TABS
60.0000 mg | ORAL_TABLET | Freq: Once | ORAL | Status: AC
  Administered 2023-03-03: 60 mg via ORAL
  Filled 2023-03-03: qty 1

## 2023-03-03 NOTE — ED Notes (Signed)
 Provider at bedside

## 2023-03-03 NOTE — ED Notes (Signed)
AVS provided by edp was reviewed with pt.pt verbalized understanding with no additional questions at this time.

## 2023-03-03 NOTE — Discharge Instructions (Addendum)
The cause for your pain is not clear, but you do not show any sign of infection or kidney problems.  You may take ibuprofen or naproxen as needed for pain, add acetaminophen if you need additional pain relief.  Return if you start running a fever or pain is getting worse.  Otherwise, follow-up with your primary care provider for recheck in 5 days.

## 2023-03-13 ENCOUNTER — Ambulatory Visit (INDEPENDENT_AMBULATORY_CARE_PROVIDER_SITE_OTHER): Payer: Managed Care, Other (non HMO)

## 2023-03-13 ENCOUNTER — Ambulatory Visit
Admission: EM | Admit: 2023-03-13 | Discharge: 2023-03-13 | Disposition: A | Discharge status: Home / Self Care | Payer: Managed Care, Other (non HMO)

## 2023-03-13 ENCOUNTER — Ambulatory Visit (HOSPITAL_COMMUNITY): Payer: Managed Care, Other (non HMO) | Attending: Physician Assistant

## 2023-03-13 ENCOUNTER — Encounter: Payer: Self-pay | Admitting: Emergency Medicine

## 2023-03-13 DIAGNOSIS — M7989 Other specified soft tissue disorders: Secondary | ICD-10-CM

## 2023-03-13 DIAGNOSIS — M25571 Pain in right ankle and joints of right foot: Secondary | ICD-10-CM

## 2023-03-13 DIAGNOSIS — M79671 Pain in right foot: Secondary | ICD-10-CM | POA: Diagnosis not present

## 2023-03-13 DIAGNOSIS — R2241 Localized swelling, mass and lump, right lower limb: Secondary | ICD-10-CM

## 2023-03-13 DIAGNOSIS — M79661 Pain in right lower leg: Secondary | ICD-10-CM

## 2023-03-13 MED ORDER — IBUPROFEN 800 MG PO TABS: 800.0000 mg | ORAL_TABLET | Freq: Three times a day (TID) | ORAL | 0 refills | Status: DC | PRN

## 2023-03-13 NOTE — ED Provider Notes (Signed)
[facility]    CSN: [id number] Arrival date & time: 03/13/23  8469      History   Chief Complaint Chief Complaint  Patient presents with   Foot Pain    HPI Tamara Rowland is a 43 y.o. female.   Patient presents today with a 2-week history of right foot and ankle pain and swelling.  She was seen in the emergency room on 03/02/2023 at which point CBC, CMP, UA were essentially normal and she was started on prednisone 60 mg and furosemide.  She has been taking these medications without improvement of symptoms.  She denies any known injury or change in activity prior to symptom onset.  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  She denies personal or family history of VTE event.  She denies any chest pain, shortness of breath, palpitations, lightheadedness.  She has developed a painful knot on her medial right lower leg and reports that the swelling in her foot and ankle have provided her from wearing normal shoes.  She did recently fly but this has been within the past few days she denies any recent travel, immobilization, hospitalization, COVID-19, active malignancy prior to symptom onset. Pain is rated 6 on a 0-10 pain scale, described as aching, worse with palpation, no alleviating factors identified.    Past Medical History:  Diagnosis Date   Anemia 2003   ongoing, has had heavy periods, iron deficiency   Dysmenorrhea    Nexplanon in place 01/2012   Obesity     Patient Active Problem List   Diagnosis Date Noted   Nipple inflammation 02/12/2020   Leukocytosis 02/12/2020   Pneumonia due to COVID-19 virus 01/25/2020   Hypoxia 01/25/2020   Nonintractable headache 01/25/2020   Hot flashes 01/25/2020   Microcytic anemia 01/25/2020   COVID-19 virus infection 01/19/2020   Hypokalemia 01/19/2020   Acute hypoxemic respiratory failure due to COVID-19 (HCC) 01/19/2020   Polydipsia 11/20/2019   BMI 60.0-69.9, adult (HCC) 11/20/2019   DOE (dyspnea on exertion)  08/12/2019   Edema of both lower extremities 08/12/2019   Impaired fasting blood sugar 08/12/2019   Vitamin D deficiency 07/20/2019   Folic acid deficiency 07/20/2019   Witnessed apneic spells 07/20/2019   Daytime somnolence 07/20/2019   Snoring 07/20/2019   Abnormal urine odor 07/20/2019   Dysuria 07/20/2019   Weight gain 07/20/2019   Class 3 severe obesity due to excess calories with serious comorbidity and body mass index (BMI) of 60.0 to 69.9 in adult (HCC) 07/20/2019   Iron deficiency 07/20/2019   Prediabetes 10/27/2018   Upper back pain, chronic 07/16/2018   Breast hypertrophy in female 07/16/2018   Dysmenorrhea 08/15/2015   Iron deficiency anemia 08/15/2015   Nexplanon in place 08/15/2015   Morbid obesity (HCC) 08/15/2015   Routine general medical examination at a health care facility 08/15/2015   Screening for condition 08/15/2015   Urinary frequency 08/15/2015   Family history of thyroid disease 08/15/2015   Vaginal discharge 08/15/2015    Past Surgical History:  Procedure Laterality Date   DILATION AND CURETTAGE OF UTERUS  2013   INSERTION OF CONTRACEPTIVE CAPSULE  01/2012   nexplanon    OB History   No obstetric history on file.      Home Medications    Prior to Admission medications   Medication Sig Start Date End Date Taking? Authorizing Provider  acetaminophen (TYLENOL) 325 MG tablet Take 650 mg by mouth every 6 (six) hours as  needed for mild pain, fever or headache.   Yes [provider]  furosemide (LASIX) 40 MG tablet Take 1 tablet (40 mg total) by mouth daily. 03/03/23  Yes Dione Booze, MD  GALLIFREY 5 MG tablet Take by mouth.   Yes [provider]  WEGOVY 2.4 MG/0.75ML SOAJ Inject into the skin. 01/07/23  Yes [provider]  albuterol (VENTOLIN HFA) 108 (90 Base) MCG/ACT inhaler Inhale 2 puffs into the lungs every 4 (four) hours as needed for wheezing or shortness of breath. 01/15/20   Tilden Fossa, MD  fluconazole  (DIFLUCAN) 150 MG tablet Take 1 po today and repeat in 3 days Patient not taking: Reported on 03/13/2023 10/16/22   [provider]  Fluticasone-Salmeterol (ADVAIR DISKUS) 250-50 MCG/DOSE AEPB Inhale 1 puff into the lungs in the morning and at bedtime. Patient not taking: Reported on 08/04/2020 05/21/20 05/21/21  Tysinger, [name], PA-C  ibuprofen (ADVIL) 800 MG tablet Take 1 tablet (800 mg total) by mouth every 8 (eight) hours as needed. 03/13/23   Kaiyah Eber K, PA-C  Insulin Pen Needle (BD PEN NEEDLE NANO U/F) 32G X 4 MM MISC 1 each by Does not apply route at bedtime. Patient not taking: Reported on 03/13/2023 08/13/19   Tysinger, [name], PA-C  Insulin Pen Needle (BD PEN NEEDLE NANO U/F) 32G X 4 MM MISC 1 each by Does not apply route at bedtime. Patient not taking: Reported on 03/13/2023 11/20/19   Tysinger, [name], PA-C  Multiple Vitamins-Minerals (EMERGEN-C IMMUNE PLUS) PACK Take 1 tablet by mouth 2 (two) times daily. Patient not taking: Reported on 08/04/2020 01/25/20   Tysinger, [name], PA-C  ondansetron (ZOFRAN-ODT) 4 MG disintegrating tablet Take 1 tablet (4 mg total) by mouth every 8 (eight) hours as needed for nausea or vomiting. 07/29/22   Small, Brooke L, PA  Semaglutide, 1 MG/DOSE, (OZEMPIC, 1 MG/DOSE,) 4 MG/3ML SOPN Inject 1 mg into the skin once a week. Patient not taking: Reported on 03/13/2023 08/07/21   [provider]  tiZANidine (ZANAFLEX) 4 MG tablet Take by mouth. 12/26/20   [provider]    Family History Family History  Problem Relation Age of Onset   Hypertension Mother    Obesity Mother    Diabetes Maternal Aunt    Cancer Maternal Grandmother        stomach   Diabetes Maternal Grandmother    Hypertension Maternal Grandmother    Cancer Other        stomach, cervical, bone, prostate, distant relatives   Diabetes Maternal Aunt    Stroke Neg Hx    Heart disease Neg Hx     Social History Social History   Tobacco Use   Smoking status: Never    Smokeless tobacco: Never  Vaping Use   Vaping status: Never Used  Substance Use Topics   Alcohol use: No    Alcohol/week: 0.0 standard drinks of alcohol   Drug use: No     Allergies   Patient has no known allergies.   Review of Systems Review of Systems  Constitutional:  Positive for activity change. Negative for appetite change, fatigue and fever.  Respiratory:  Negative for cough and shortness of breath.   Cardiovascular:  Positive for leg swelling. Negative for chest pain and palpitations.  Musculoskeletal:  Positive for arthralgias, gait problem and myalgias. Negative for joint swelling.  Neurological:  Negative for dizziness, weakness, light-headedness, numbness and headaches.     Physical Exam Triage Vital Signs ED Triage  Vitals  Encounter Vitals Group     BP 03/13/23 1003 (!) 131/90     Systolic BP Percentile --      Diastolic BP Percentile --      Pulse Rate 03/13/23 1003 91     Resp 03/13/23 1003 16     Temp 03/13/23 1003 97.9 F (36.6 C)     Temp Source 03/13/23 1003 Oral     SpO2 03/13/23 1003 98 %     Weight --      Height --      Head Circumference --      Peak Flow --      Pain Score 03/13/23 1004 6     Pain Loc --      Pain Education --      Exclude from Growth Chart --    No data found.  Updated Vital Signs BP (!) 131/90 (BP Location: Left Wrist)   Pulse 91   Temp 97.9 F (36.6 C) (Oral)   Resp 16   LMP  (LMP Unknown)   SpO2 98%   Visual Acuity Right Eye Distance:   Left Eye Distance:   Bilateral Distance:    Right Eye Near:   Left Eye Near:    Bilateral Near:     Physical Exam Vitals reviewed.  Constitutional:      General: She is awake. She is not in acute distress.    Appearance: Normal appearance. She is well-developed. She is not ill-appearing.     Comments: Very pleasant female appears stated age in no acute distress sitting comfortably in exam room  HENT:     Head: Normocephalic and atraumatic.     Mouth/Throat:      Pharynx: Uvula midline. No oropharyngeal exudate or posterior oropharyngeal erythema.  Cardiovascular:     Rate and Rhythm: Normal rate and regular rhythm.     Heart sounds: Normal heart sounds, S1 normal and S2 normal. No murmur heard.    Comments: Nonpitting edema noted bilateral ankles Pulmonary:     Effort: Pulmonary effort is normal.     Breath sounds: Normal breath sounds. No wheezing, rhonchi or rales.  Abdominal:     General: Bowel sounds are normal.     Palpations: Abdomen is soft.     Tenderness: There is no abdominal tenderness. There is no right CVA tenderness, left CVA tenderness, guarding or rebound.  Musculoskeletal:     Right ankle: No swelling. Tenderness present over the lateral malleolus. Normal range of motion.     Right Achilles Tendon: Normal.     Right foot: Normal range of motion and normal capillary refill. Swelling and tenderness present. No bony tenderness.     Comments: Right ankle/foot: Swelling and tenderness palpation over lateral malleolus and along fifth metatarsal.  No deformity noted.  Foot is neurovascularly intact.  Positive Homans' sign.  No pitting edema in lower leg.  She does have a painful nodule medial right lower leg measuring approximately 3 cm x 2 cm but no palpable cords or additional abnormalities.  Psychiatric:        Behavior: Behavior is cooperative.      UC Treatments / Results  Labs (all labs ordered are listed, but only abnormal results are displayed) Labs Reviewed - No data to display  EKG   Radiology No results found.  Procedures Procedures (including critical care time)  Medications Ordered in UC Medications - No data to display  Initial Impression / Assessment and Plan / UC Course  I have reviewed the triage vital signs and the nursing notes.  Pertinent labs & imaging results that were available during my care of the patient were reviewed by me and considered in my medical decision making (see chart for details).      Patient is well-appearing, afebrile, nontoxic, nontachycardic.  X-ray of foot and ankle were obtained given bony tenderness that showed no acute osseous abnormality based on my primary read.  At the time of discharge we were waiting for radiologist over read and we will contact her if this is different and changes our treatment plan.  Low suspicion for DVT, however, given increasing pain and swelling in her right leg will obtain ultrasound to rule out VTE event.  This was ordered at the time of discharge and patient will go have this done today.  She was encouraged to use compression and elevation to manage symptoms.  She was started on ibuprofen 800 mg to help with pain and we discussed that she is not to take additional NSAIDs with this medication due to risk of GI bleeding.  Encouraged her to follow-up with [facility] if all of her testing is negative and she continues to have pain.  She was given the contact information for this group and encouraged to call and schedule an appointment.  Discussed that if anything worsens or changes and she has increasing pain, wound, redness, worsening swelling, chest pain, shortness of breath, palpitations she needs to be seen immediately.  Strict return precautions given.  Excuse note provided.  Final Clinical Impressions(s) / UC Diagnoses   Final diagnoses:  Acute right ankle pain  Right foot pain  Subcutaneous nodule of right lower leg  Pain and swelling of right lower leg     Discharge Instructions      I did not see anything on your x-ray.  I will contact you if the radiologist sees something that I missed.  Keep your foot elevated and use compression to help with your symptoms.  Start ibuprofen 800 mg up to 3 times a day.  Do not take additional NSAIDs with this medication including aspirin, ibuprofen/Advil, naproxen/Aleve.  Follow-up with [facility] if your symptoms do not get better and all of your testing is negative.  Please go  get the ultrasound and I will call you if this is abnormal.  If anything worsens or changes please be seen immediately.     ED Prescriptions     Medication Sig Dispense Auth. Provider   ibuprofen (ADVIL) 800 MG tablet Take 1 tablet (800 mg total) by mouth every 8 (eight) hours as needed. 21 tablet Grant Henkes, Noberto Retort, PA-C      PDMP not reviewed this encounter.   Jeani Hawking, PA-C 03/13/23 1221

## 2023-03-13 NOTE — Discharge Instructions (Signed)
I did not see anything on your x-ray.  I will contact you if the radiologist sees something that I missed.  Keep your foot elevated and use compression to help with your symptoms.  Start ibuprofen 800 mg up to 3 times a day.  Do not take additional NSAIDs with this medication including aspirin, ibuprofen/Advil, naproxen/Aleve.  Follow-up with [facility] if your symptoms do not get better and all of your testing is negative.  Please go get the ultrasound and I will call you if this is abnormal.  If anything worsens or changes please be seen immediately.

## 2023-03-13 NOTE — ED Triage Notes (Addendum)
Pt has bilateral swelling to both feet x2 weeks. R is worse than L. She has also noticed red bumps that appeared on both ankles that have since become sores as well as a knot to R inner calf. She reports going to [facility] ED 2 weeks ago with initial symptoms and told it was fluid buildup. Given prednisone and lasix x5 days with no relief. Pt did have a recent flight last week that could have contributed to swelling. Pt reports constant pain with no relief from tylenol and ibuprofen.

## 2023-10-24 ENCOUNTER — Other Ambulatory Visit: Payer: Self-pay

## 2023-10-24 ENCOUNTER — Emergency Department (HOSPITAL_BASED_OUTPATIENT_CLINIC_OR_DEPARTMENT_OTHER): Admitting: Radiology

## 2023-10-24 ENCOUNTER — Emergency Department (HOSPITAL_BASED_OUTPATIENT_CLINIC_OR_DEPARTMENT_OTHER)
Admission: EM | Admit: 2023-10-24 | Discharge: 2023-10-24 | Disposition: A | Discharge status: Home / Self Care | Attending: Emergency Medicine | Admitting: Emergency Medicine

## 2023-10-24 ENCOUNTER — Encounter (HOSPITAL_BASED_OUTPATIENT_CLINIC_OR_DEPARTMENT_OTHER): Payer: Self-pay

## 2023-10-24 DIAGNOSIS — M79602 Pain in left arm: Secondary | ICD-10-CM | POA: Diagnosis present

## 2023-10-24 DIAGNOSIS — M7712 Lateral epicondylitis, left elbow: Secondary | ICD-10-CM | POA: Diagnosis not present

## 2023-10-24 MED ORDER — IBUPROFEN 800 MG PO TABS: 800.0000 mg | ORAL_TABLET | Freq: Three times a day (TID) | ORAL | 0 refills | Status: AC | PRN

## 2023-10-24 NOTE — ED Provider Notes (Signed)
 [place] EMERGENCY DEPARTMENT AT [facility] Provider Note   CSN: [id number] Arrival date & time: 10/24/23  0407     Patient presents with: Arm Pain   Tamara Rowland is a 43 y.o. female.   Presents with the complaint of left arm pain.  Patient reports that she woke up with pain yesterday.  Pain is on the lateral aspect of her elbow and does radiate down the arm.  Patient reports that she cannot fully extend the arm because of the pain.  Denies any direct injury.       Prior to Admission medications   Medication Sig Start Date End Date Taking? Authorizing Provider  acetaminophen  (TYLENOL ) 325 MG tablet Take 650 mg by mouth every 6 (six) hours as needed for mild pain, fever or headache.    [provider]  albuterol  (VENTOLIN  HFA) 108 (90 Base) MCG/ACT inhaler Inhale 2 puffs into the lungs every 4 (four) hours as needed for wheezing or shortness of breath. 01/15/20   Griselda Norris, MD  fluconazole  (DIFLUCAN ) 150 MG tablet Take 1 po today and repeat in 3 days Patient not taking: Reported on 03/13/2023 10/16/22   [provider]  Fluticasone -Salmeterol (ADVAIR DISKUS) 250-50 MCG/DOSE AEPB Inhale 1 puff into the lungs in the morning and at bedtime. Patient not taking: Reported on 08/04/2020 05/21/20 05/21/21  Tysinger, Alm RAMAN, PA-C  furosemide  (LASIX ) 40 MG tablet Take 1 tablet (40 mg total) by mouth daily. 03/03/23   Raford Alm, MD  GALLIFREY 5 MG tablet Take by mouth.    [provider]  ibuprofen  (ADVIL ) 800 MG tablet Take 1 tablet (800 mg total) by mouth every 8 (eight) hours as needed for moderate pain (pain score 4-6). 10/24/23   Haze Lonni PARAS, MD  Insulin  Pen Needle (BD PEN NEEDLE NANO U/F) 32G X 4 MM MISC 1 each by Does not apply route at bedtime. Patient not taking: Reported on 03/13/2023 08/13/19   Tysinger, Alm RAMAN, PA-C  Insulin  Pen Needle (BD PEN NEEDLE NANO U/F) 32G X 4 MM MISC 1 each by Does not apply route at bedtime. Patient not  taking: Reported on 03/13/2023 11/20/19   Tysinger, Alm RAMAN, PA-C  Multiple Vitamins-Minerals (EMERGEN-C IMMUNE PLUS) PACK Take 1 tablet by mouth 2 (two) times daily. Patient not taking: Reported on 08/04/2020 01/25/20   Tysinger, Alm RAMAN, PA-C  ondansetron  (ZOFRAN -ODT) 4 MG disintegrating tablet Take 1 tablet (4 mg total) by mouth every 8 (eight) hours as needed for nausea or vomiting. 07/29/22   Small, Brooke L, PA  Semaglutide , 1 MG/DOSE, (OZEMPIC , 1 MG/DOSE,) 4 MG/3ML SOPN Inject 1 mg into the skin once a week. Patient not taking: Reported on 03/13/2023 08/07/21   [provider]  tiZANidine (ZANAFLEX) 4 MG tablet Take by mouth. 12/26/20   [provider]  WEGOVY 2.4 MG/0.75ML SOAJ Inject into the skin. 01/07/23   [provider]    Allergies: Patient has no known allergies.    Review of Systems  Updated Vital Signs BP (!) 152/88   Pulse 85   Temp 99 F (37.2 C) (Oral)   Resp 18   SpO2 96%   Physical Exam Vitals and nursing note reviewed.  Constitutional:      Appearance: Normal appearance.  HENT:     Head: Normocephalic.  Cardiovascular:     Rate and Rhythm: Normal rate.     Pulses: Normal pulses.  Musculoskeletal:     Left elbow: No swelling or deformity. Decreased range of  motion (Painful inhibition). Tenderness present in lateral epicondyle.  Neurological:     Mental Status: She is alert.     (all labs ordered are listed, but only abnormal results are displayed) Labs Reviewed - No data to display  EKG: None  Radiology: DG Elbow Complete Left Result Date: 10/24/2023 EXAM: 3 VIEW(S) XRAY OF THE LEFT ELBOW COMPARISON: None available. CLINICAL HISTORY: pain. Pt states that she woke up yesterday with left elbow and forearm pain. Pt denies specific injury. FINDINGS: BONES AND JOINTS: No acute fracture. No focal osseous lesion. No joint dislocation. No joint effusion. SOFT TISSUES: The soft tissues are unremarkable. IMPRESSION: 1. No acute  abnormality. Electronically signed by: Evalene Coho MD 10/24/2023 05:33 AM EDT RP Workstation: [id number]     Procedures   Medications Ordered in the ED - No data to display                                  Medical Decision Making Amount and/or Complexity of Data Reviewed Radiology: ordered.   Presents with atraumatic left elbow pain.  Patient with significant tenderness over the lateral epicondyle consistent with epicondylitis.  No known trauma.  X-ray unremarkable.  No erythema, warmth to suggest septic arthritis.  No septic arthritis risk factors.     Final diagnoses:  Lateral epicondylitis of left elbow    ED Discharge Orders          Ordered    ibuprofen  (ADVIL ) 800 MG tablet  Every 8 hours PRN        10/24/23 0539               Haze Lonni PARAS, MD 10/24/23 [phone]

## 2023-10-24 NOTE — ED Triage Notes (Signed)
 Pt states that she woke up yesterday with left elbow and forearm pain. Pt denies specific injury.
